# Patient Record
Sex: Female | Born: 1980 | Race: Black or African American | Hispanic: No | Marital: Single | Smoking: Never smoker
Health system: Southern US, Community
[De-identification: ages and names within clinical notes are randomized; demographics above are authoritative.]

## PROBLEM LIST (undated history)

## (undated) ENCOUNTER — Ambulatory Visit: Discharge status: Absent without leave | Payer: Medicaid Other

## (undated) DIAGNOSIS — O169 Unspecified maternal hypertension, unspecified trimester: Secondary | ICD-10-CM

## (undated) HISTORY — PX: OTHER SURGICAL HISTORY: SHX169

## (undated) HISTORY — PX: TUBAL LIGATION: SHX77

---

## 1998-07-08 ENCOUNTER — Emergency Department (HOSPITAL_COMMUNITY): Admission: EM | Admit: 1998-07-08 | Discharge: 1998-07-08 | Payer: Self-pay | Admitting: Emergency Medicine

## 2000-06-21 ENCOUNTER — Emergency Department (HOSPITAL_COMMUNITY): Admission: EM | Admit: 2000-06-21 | Discharge: 2000-06-21 | Payer: Self-pay | Admitting: Emergency Medicine

## 2000-06-21 ENCOUNTER — Encounter: Payer: Self-pay | Admitting: Emergency Medicine

## 2000-06-23 ENCOUNTER — Inpatient Hospital Stay (HOSPITAL_COMMUNITY): Admission: AD | Admit: 2000-06-23 | Discharge: 2000-06-23 | Payer: Self-pay | Admitting: Obstetrics

## 2000-06-25 ENCOUNTER — Inpatient Hospital Stay (HOSPITAL_COMMUNITY): Admission: AD | Admit: 2000-06-25 | Discharge: 2000-06-25 | Payer: Self-pay | Admitting: *Deleted

## 2000-07-23 ENCOUNTER — Inpatient Hospital Stay (HOSPITAL_COMMUNITY): Admission: AD | Admit: 2000-07-23 | Discharge: 2000-07-23 | Payer: Self-pay | Admitting: Obstetrics & Gynecology

## 2000-08-23 ENCOUNTER — Ambulatory Visit (HOSPITAL_COMMUNITY): Admission: RE | Admit: 2000-08-23 | Discharge: 2000-08-23 | Payer: Self-pay | Admitting: *Deleted

## 2000-10-06 ENCOUNTER — Ambulatory Visit (HOSPITAL_COMMUNITY): Admission: RE | Admit: 2000-10-06 | Discharge: 2000-10-06 | Payer: Self-pay | Admitting: *Deleted

## 2000-11-29 ENCOUNTER — Encounter: Payer: Self-pay | Admitting: Obstetrics

## 2000-11-29 ENCOUNTER — Inpatient Hospital Stay (HOSPITAL_COMMUNITY): Admission: AD | Admit: 2000-11-29 | Discharge: 2000-12-05 | Payer: Self-pay | Admitting: Obstetrics

## 2000-12-17 ENCOUNTER — Encounter (HOSPITAL_COMMUNITY): Admission: RE | Admit: 2000-12-17 | Discharge: 2001-01-05 | Payer: Self-pay | Admitting: *Deleted

## 2000-12-23 ENCOUNTER — Encounter: Admission: RE | Admit: 2000-12-23 | Discharge: 2000-12-23 | Payer: Self-pay | Admitting: Obstetrics

## 2000-12-30 ENCOUNTER — Inpatient Hospital Stay (HOSPITAL_COMMUNITY): Admission: AD | Admit: 2000-12-30 | Discharge: 2000-12-30 | Payer: Self-pay | Admitting: Obstetrics & Gynecology

## 2001-01-04 ENCOUNTER — Encounter (INDEPENDENT_AMBULATORY_CARE_PROVIDER_SITE_OTHER): Payer: Self-pay

## 2001-01-04 ENCOUNTER — Inpatient Hospital Stay (HOSPITAL_COMMUNITY): Admission: AD | Admit: 2001-01-04 | Discharge: 2001-01-06 | Payer: Self-pay | Admitting: *Deleted

## 2001-11-27 ENCOUNTER — Emergency Department (HOSPITAL_COMMUNITY): Admission: EM | Admit: 2001-11-27 | Discharge: 2001-11-27 | Payer: Self-pay | Admitting: Emergency Medicine

## 2002-06-14 ENCOUNTER — Emergency Department (HOSPITAL_COMMUNITY): Admission: EM | Admit: 2002-06-14 | Discharge: 2002-06-14 | Payer: Self-pay

## 2002-08-15 ENCOUNTER — Ambulatory Visit (HOSPITAL_COMMUNITY): Admission: RE | Admit: 2002-08-15 | Discharge: 2002-08-15 | Payer: Self-pay | Admitting: *Deleted

## 2002-08-23 ENCOUNTER — Encounter: Admission: RE | Admit: 2002-08-23 | Discharge: 2002-08-23 | Payer: Self-pay | Admitting: *Deleted

## 2002-08-31 ENCOUNTER — Ambulatory Visit (HOSPITAL_COMMUNITY): Admission: RE | Admit: 2002-08-31 | Discharge: 2002-08-31 | Payer: Self-pay | Admitting: *Deleted

## 2002-08-31 ENCOUNTER — Encounter: Admission: RE | Admit: 2002-08-31 | Discharge: 2002-08-31 | Payer: Self-pay | Admitting: *Deleted

## 2002-09-01 ENCOUNTER — Inpatient Hospital Stay (HOSPITAL_COMMUNITY): Admission: AD | Admit: 2002-09-01 | Discharge: 2002-09-04 | Payer: Self-pay | Admitting: *Deleted

## 2002-09-05 ENCOUNTER — Inpatient Hospital Stay (HOSPITAL_COMMUNITY): Admission: AD | Admit: 2002-09-05 | Discharge: 2002-09-05 | Payer: Self-pay | Admitting: *Deleted

## 2002-09-07 ENCOUNTER — Encounter: Admission: RE | Admit: 2002-09-07 | Discharge: 2002-09-07 | Payer: Self-pay | Admitting: *Deleted

## 2002-10-10 ENCOUNTER — Inpatient Hospital Stay (HOSPITAL_COMMUNITY): Admission: AD | Admit: 2002-10-10 | Discharge: 2002-10-10 | Payer: Self-pay | Admitting: *Deleted

## 2002-10-18 ENCOUNTER — Encounter: Admission: RE | Admit: 2002-10-18 | Discharge: 2002-10-18 | Payer: Self-pay | Admitting: *Deleted

## 2002-10-25 ENCOUNTER — Encounter: Admission: RE | Admit: 2002-10-25 | Discharge: 2002-10-25 | Payer: Self-pay | Admitting: *Deleted

## 2002-11-08 ENCOUNTER — Encounter: Admission: RE | Admit: 2002-11-08 | Discharge: 2002-11-08 | Payer: Self-pay | Admitting: *Deleted

## 2002-11-29 ENCOUNTER — Encounter: Admission: RE | Admit: 2002-11-29 | Discharge: 2002-11-29 | Payer: Self-pay | Admitting: *Deleted

## 2002-12-27 ENCOUNTER — Inpatient Hospital Stay: Admission: AD | Admit: 2002-12-27 | Discharge: 2002-12-27 | Payer: Self-pay | Admitting: Obstetrics and Gynecology

## 2002-12-27 ENCOUNTER — Encounter: Admission: RE | Admit: 2002-12-27 | Discharge: 2002-12-27 | Payer: Self-pay | Admitting: *Deleted

## 2002-12-31 ENCOUNTER — Inpatient Hospital Stay (HOSPITAL_COMMUNITY): Admission: AD | Admit: 2002-12-31 | Discharge: 2002-12-31 | Payer: Self-pay | Admitting: *Deleted

## 2003-01-03 ENCOUNTER — Encounter: Admission: RE | Admit: 2003-01-03 | Discharge: 2003-01-03 | Payer: Self-pay | Admitting: *Deleted

## 2003-01-08 ENCOUNTER — Inpatient Hospital Stay (HOSPITAL_COMMUNITY): Admission: AD | Admit: 2003-01-08 | Discharge: 2003-01-08 | Payer: Self-pay | Admitting: Obstetrics & Gynecology

## 2003-01-10 ENCOUNTER — Encounter: Admission: RE | Admit: 2003-01-10 | Discharge: 2003-01-10 | Payer: Self-pay | Admitting: *Deleted

## 2003-01-14 ENCOUNTER — Observation Stay (HOSPITAL_COMMUNITY): Admission: AD | Admit: 2003-01-14 | Discharge: 2003-01-14 | Payer: Self-pay | Admitting: *Deleted

## 2003-01-17 ENCOUNTER — Encounter: Admission: RE | Admit: 2003-01-17 | Discharge: 2003-01-17 | Payer: Self-pay | Admitting: *Deleted

## 2003-01-24 ENCOUNTER — Encounter: Admission: RE | Admit: 2003-01-24 | Discharge: 2003-01-24 | Payer: Self-pay | Admitting: Family Medicine

## 2003-01-26 ENCOUNTER — Inpatient Hospital Stay (HOSPITAL_COMMUNITY): Admission: AD | Admit: 2003-01-26 | Discharge: 2003-01-26 | Payer: Self-pay | Admitting: Obstetrics & Gynecology

## 2003-01-31 ENCOUNTER — Encounter: Admission: RE | Admit: 2003-01-31 | Discharge: 2003-01-31 | Payer: Self-pay | Admitting: *Deleted

## 2003-02-01 ENCOUNTER — Inpatient Hospital Stay (HOSPITAL_COMMUNITY): Admission: AD | Admit: 2003-02-01 | Discharge: 2003-02-04 | Payer: Self-pay | Admitting: Obstetrics & Gynecology

## 2003-02-02 ENCOUNTER — Encounter (INDEPENDENT_AMBULATORY_CARE_PROVIDER_SITE_OTHER): Payer: Self-pay | Admitting: *Deleted

## 2004-11-27 ENCOUNTER — Emergency Department (HOSPITAL_COMMUNITY): Admission: EM | Admit: 2004-11-27 | Discharge: 2004-11-27 | Payer: Self-pay | Admitting: Emergency Medicine

## 2007-02-19 ENCOUNTER — Emergency Department (HOSPITAL_COMMUNITY): Admission: EM | Admit: 2007-02-19 | Discharge: 2007-02-19 | Payer: Self-pay | Admitting: Emergency Medicine

## 2007-08-01 ENCOUNTER — Emergency Department (HOSPITAL_COMMUNITY): Admission: EM | Admit: 2007-08-01 | Discharge: 2007-08-01 | Payer: Self-pay | Admitting: Emergency Medicine

## 2007-08-08 ENCOUNTER — Emergency Department (HOSPITAL_COMMUNITY): Admission: EM | Admit: 2007-08-08 | Discharge: 2007-08-08 | Payer: Self-pay | Admitting: Emergency Medicine

## 2009-03-21 ENCOUNTER — Emergency Department (HOSPITAL_COMMUNITY): Admission: EM | Admit: 2009-03-21 | Discharge: 2009-03-22 | Payer: Self-pay | Admitting: Emergency Medicine

## 2009-03-23 ENCOUNTER — Emergency Department (HOSPITAL_COMMUNITY): Admission: EM | Admit: 2009-03-23 | Discharge: 2009-03-23 | Payer: Self-pay | Admitting: Emergency Medicine

## 2010-09-09 ENCOUNTER — Emergency Department (HOSPITAL_COMMUNITY)
Admission: EM | Admit: 2010-09-09 | Discharge: 2010-09-09 | Disposition: A | Discharge status: Home / Self Care | Payer: Self-pay | Attending: Emergency Medicine | Admitting: Emergency Medicine

## 2010-09-09 DIAGNOSIS — K5289 Other specified noninfective gastroenteritis and colitis: Secondary | ICD-10-CM | POA: Insufficient documentation

## 2010-09-09 DIAGNOSIS — R112 Nausea with vomiting, unspecified: Secondary | ICD-10-CM | POA: Insufficient documentation

## 2010-09-09 DIAGNOSIS — R197 Diarrhea, unspecified: Secondary | ICD-10-CM | POA: Insufficient documentation

## 2010-09-09 DIAGNOSIS — R5381 Other malaise: Secondary | ICD-10-CM | POA: Insufficient documentation

## 2010-09-09 DIAGNOSIS — R109 Unspecified abdominal pain: Secondary | ICD-10-CM | POA: Insufficient documentation

## 2010-09-09 LAB — URINE MICROSCOPIC-ADD ON

## 2010-09-09 LAB — POCT I-STAT, CHEM 8
BUN: 8 mg/dL (ref 6–23)
Calcium, Ion: 1.14 mmol/L (ref 1.12–1.32)
Hemoglobin: 14.3 g/dL (ref 12.0–15.0)
TCO2: 23 mmol/L (ref 0–100)

## 2010-09-09 LAB — POCT PREGNANCY, URINE

## 2010-09-09 LAB — URINALYSIS, ROUTINE W REFLEX MICROSCOPIC
Urine Glucose, Fasting: NEGATIVE mg/dL
Urobilinogen, UA: 1 mg/dL (ref 0.0–1.0)

## 2010-09-10 LAB — URINE CULTURE: Culture: NO GROWTH

## 2010-11-28 NOTE — Op Note (Signed)
   Destiny Wallace, Destiny Wallace                  ACCOUNT NO.:  [id number]   MEDICAL RECORD NO.:  [id number]                   PATIENT TYPE:  INP   LOCATION:  9159                                 FACILITY:  [facility]   PHYSICIAN:  Conni Elliot, M.D.             DATE OF BIRTH:  23-Sep-1980   DATE OF PROCEDURE:  09/02/2002  DATE OF DISCHARGE:  09/04/2002                                 OPERATIVE REPORT   PREOPERATIVE DIAGNOSES:  Weak cervix.   POSTOPERATIVE DIAGNOSES:  Weak cervix.   OPERATION:  Modified McDonald cerclage.   OPERATOR:  Conni Elliot, M.D.   ANESTHESIA:  Spinal.                                                 Conni Elliot, M.D.    ASG/MEDQ  D:  10/03/2002  T:  10/03/2002  Job:  [id number]

## 2010-11-28 NOTE — Discharge Summary (Signed)
   Destiny Wallace, Destiny Wallace                  ACCOUNT NO.:  [id number]   MEDICAL RECORD NO.:  [id number]                   PATIENT TYPE:  INP   LOCATION:  9159                                 FACILITY:  [facility]   PHYSICIAN:  Maurice March, M.D.                  DATE OF BIRTH:  08-08-1980   DATE OF ADMISSION:  09/01/2002  DATE OF DISCHARGE:  09/04/2002                                 DISCHARGE SUMMARY   ADMISSION DIAGNOSES:  32. A 30 year old G2, P1-0-0-1, at 18-6/7 weeks.  2. Shortened cervical length, digital examination, and funneling of cervix     on ultrasound.   DISCHARGE DIAGNOSES:  59. A 30 year old, G2, P1-0-0-1, at 19-3/7 weeks.  2. Postoperative day #2, status post Cerclage placement.  3. Pre-term uterine contractions, resolved with tocolysis.   DISCHARGE MEDICATIONS:  1. Procardia 10 mg p.o. q.6h.  2. Prenatal vitamins one p.o. daily.   HISTORY OF PRESENT ILLNESS:  The patient is a 30 year old, G2, P1-0-0-1, who  presented at 18-6/7 weeks.  [facility] she was noted to have a  shortened cervical length on examination.  Antiphospholipid panel was drawn.  An ultrasound showed a dynamic cervix with funneling.  She is brought into  the hospital for Cerclage.   HOSPITAL COURSE:  The Cerclage was placed on hospital day #1.  The patient  tolerated the procedure well.  However, following this she began having  cramping which felt like menstrual cramps.  She did have some contractions  noted on tocometer.  She was given subcutaneous terbutaline x2, and was  placed on Procardia.  Following the tocolysis, the patient had no further  contractions.  On the day of discharge, the patient had one contraction  noted on the monitor within 12 hours.   CONDITION ON DISCHARGE:  The patient was discharged home in stable  condition.   DISCHARGE INSTRUCTIONS:  The patient was told of her medical regimen as well  as to follow up in [facility] on 09/07/02 in the morning.  She  was told  to have no intercourse and modified bed rest until her appointment.  She is  given a note for her parole officer to excuse her for the last three days  since she was hospitalized.                                               Maurice March, M.D.    LC/MEDQ  D:  09/04/2002  T:  09/04/2002  Job:  [id number]

## 2010-11-28 NOTE — Discharge Summary (Signed)
[facility]  Patient:    Destiny Wallace, Destiny Wallace                      MRN: [id number] Adm. Date:  [id number] Disc. Date: 12/05/00 Attending:  Tammi Sou Dictator:   Marlinda Mike, C.N.M.                           Discharge Summary  ADMITTING DIAGNOSES:          Intrauterine pregnancy at 18 and 6, preterm labor.  DISCHARGE DIAGNOSES:          A 32 5/7 week, preterm labor stable on Procardia tocolytic.  HOSPITAL COURSE:              Patient received complete OB ultrasound on May 20.  Ultrasound revealed single fetus, cephalic position, placenta anterior grade 2, questionable circumvallate placenta, amniotic fluid within normal limits with a total volume of 17.2.  Anatomy had been demonstrated at previous ultrasounds.  Biophysical description would equal 6/8.  Absent breathing movements on examination.  Appropriate fetal growth with growth in the 35th percentile.  Cervical length:  No measurable cervix visible on translabial ultrasound.  Transvaginal ultrasound not done.  The patient was admitted to labor and delivery for magnesium sulfate tocolytics.  Patient received magnesium sulfate from Nov 29, 2000 to Dec 03, 2000.  Patient received Unasyn 3 g IV q.6h. from May 20 until day of discharge.  Course of steroids completed during hospitalization with betamethasone 12.5 mg IM.  Same dose repeated at 24 hours for a total of two doses.  Complete bed rest.  On examination cervix was 2-3 cm with a bulging bag of water.  Heart rate tracing was reactive with contractions every two minutes.  On day of discharge cervix was 1-2 cm, posterior, vertex ballottable at a -2 station, no bag of waters bulging. Cervix stable at discharge.  Patient started on Procardia XL 60 mg p.o. daily dose with no increase in contraction activity.  Patient discharged home on same medication.  At day of discharge contractions only occasional with no uterine irritability.  Other  hospital care includes urine drug screen with negative results.  CBC:  White blood cell count 9.4, hemoglobin 11.6, hematocrit 34.6, platelet count 253,000.  Group beta strep vaginal culture done with a negative result.  Wet prep done.  Many white blood cells.  No trich.  No yeast.  Few clue cells.  Too numerous to count bacteria. Discharged Dec 05, 2000 in stable condition, stable on Procardia XL 60 mg q.d. Discharged home on complete bed rest and pelvic rest, on preterm labor precautions.  To follow-up at [facility] this week.  Patient to call Tuesday morning for appointment time. DD:  12/05/00 TD:  12/05/00 Job: [id number]

## 2010-11-28 NOTE — Op Note (Signed)
   NAME:  Destiny Wallace, Destiny Wallace                         ACCOUNT NO.:  [id number]   MEDICAL RECORD NO.:  [id number]                   PATIENT TYPE:  INP   LOCATION:  9114                                 FACILITY:  [facility]   PHYSICIAN:  Clement Husbands, M.D.         DATE OF BIRTH:  [birth year]   DATE OF PROCEDURE:  02/02/2003  DATE OF DISCHARGE:                                 OPERATIVE REPORT   PREOPERATIVE DIAGNOSIS:  Requested sterilization.   POSTOPERATIVE DIAGNOSIS:  Requested sterilization.   OPERATION:  Postpartum bilateral tubal sterilization with partial  salpingectomy.   SURGEON:  Burnadette Peter, M.D.   ANESTHESIA:  Epidural.   PROCEDURE:  With the patient under satisfactory epidural in a supine  position, the abdomen is prepped and draped.  A small transverse  infraumbilical skin incision was made and carried down sharply through the  rectus fascia into the peritoneal cavity.  The fundal portion of the uterus  was seen.  The right fallopian tube was grasped with Babcock clamp and  followed down to the fimbriae, which was visualized.  The ovary was not  identified.  The midportion of the right fallopian tube was then doubly  ligated with 0 plain catgut with the ligated segment excised with non-  apposing edges.  The left side was then mobilized.  What was thought to be  the left fallopian tube was identified and was followed down posteriorly.  The surface of the ovary could be seen.  The fimbriae of the left fallopian  tube, however, were bound down posteriorly but were not specifically  visualized.  The length of what appeared to be fallopian tube that was  visualized from the cornu area and then as far distally was at least 4-1/2  inches.  The midportion of what appeared to be fallopian tube was then  doubly ligated with 0 plain catgut and the segment of fallopian tube excised  with non-apposing edges.  This segment was inspected, and it appeared to be  fallopian tube.   Both segments were sent separately to pathology with a  specific note to identify the left tubal segment.   The fascia and peritoneum were closed simultaneously with a running 0 Vicryl  suture.  Skin edges were approximated with a 3-0 Vicryl subcuticular stitch.  A pressure dressing was applied.  Estimated blood loss just a few  milliliters.  Sponge and needle count was correct.  She tolerated the  procedure well, was returned to the recovery room in satisfactory condition.                                               Clement Husbands, M.D.    EFR/MEDQ  D:  02/02/2003  T:  02/03/2003  Job:  [id number]

## 2010-11-28 NOTE — Op Note (Signed)
   NAMEFINLEIGH, CHEONG                  ACCOUNT NO.:  [id number]   MEDICAL RECORD NO.:  [id number]                   PATIENT TYPE:  INP   LOCATION:  9311                                 FACILITY:  [facility]   PHYSICIAN:  Conni Elliot, M.D.             DATE OF BIRTH:  18-Dec-1980   DATE OF PROCEDURE:  09/02/2002  DATE OF DISCHARGE:                                 OPERATIVE REPORT   PREOPERATIVE DIAGNOSIS:  Weak cervix.   POSTOPERATIVE DIAGNOSIS:  Weak cervix.   PROCEDURE:  Cervical cerclage.   SURGEON:  Conni Elliot, M.D.   ANESTHESIA:  Spinal.   DESCRIPTION OF PROCEDURE:  After placement of a spinal anesthetic, the  patient supine in the dorsal lithotomy position.  The perineum and vagina  were prepped with Betadine solution.  A Foley catheter was placed for  constant drainage.  A weighted speculum was placed in the posterior vagina.  A Cleocin douche was placed.  This was then removed.  Anterior cervix was  grasped with a sponge stick and a cervical cerclage was placed using #4 silk  double-stranded, starting at 12 o'clock and going counterclockwise.  This  was tied again at 12 o'clock.  Estimated blood loss was less than 10 mL.  Speculum removed.                                               Conni Elliot, M.D.    ASG/MEDQ  D:  09/02/2002  T:  09/02/2002  Job:  [id number]

## 2011-03-08 ENCOUNTER — Emergency Department (HOSPITAL_COMMUNITY)
Admission: EM | Admit: 2011-03-08 | Discharge: 2011-03-08 | Disposition: A | Discharge status: Home / Self Care | Payer: Self-pay | Attending: Emergency Medicine | Admitting: Emergency Medicine

## 2011-03-08 DIAGNOSIS — B850 Pediculosis due to Pediculus humanus capitis: Secondary | ICD-10-CM | POA: Insufficient documentation

## 2011-04-02 ENCOUNTER — Emergency Department (HOSPITAL_COMMUNITY)
Admission: EM | Admit: 2011-04-02 | Discharge: 2011-04-02 | Disposition: A | Discharge status: Home / Self Care | Payer: Self-pay | Attending: Emergency Medicine | Admitting: Emergency Medicine

## 2011-04-02 DIAGNOSIS — R220 Localized swelling, mass and lump, head: Secondary | ICD-10-CM | POA: Insufficient documentation

## 2011-04-02 DIAGNOSIS — K029 Dental caries, unspecified: Secondary | ICD-10-CM | POA: Insufficient documentation

## 2011-04-02 DIAGNOSIS — K006 Disturbances in tooth eruption: Secondary | ICD-10-CM | POA: Insufficient documentation

## 2011-04-02 DIAGNOSIS — K089 Disorder of teeth and supporting structures, unspecified: Secondary | ICD-10-CM | POA: Insufficient documentation

## 2011-04-02 LAB — CBC
Hemoglobin: 11.6 — ABNORMAL LOW
Hemoglobin: 12.6
MCHC: 33.1
RBC: 4.09
RBC: 4.48
RDW: 15.7 — ABNORMAL HIGH

## 2011-04-02 LAB — DIFFERENTIAL
Basophils Absolute: 0
Lymphocytes Relative: 33
Lymphocytes Relative: 5 — ABNORMAL LOW
Monocytes Absolute: 0.7
Monocytes Absolute: 0.7
Monocytes Relative: 11
Monocytes Relative: 18 — ABNORMAL HIGH
Neutro Abs: 1.6 — ABNORMAL LOW
Neutro Abs: 4.9

## 2011-04-02 LAB — I-STAT 8, (EC8 V) (CONVERTED LAB)
Chloride: 106
Glucose, Bld: 97
Potassium: 3.6
pH, Ven: 7.378 — ABNORMAL HIGH

## 2011-04-02 LAB — POCT I-STAT CREATININE
Creatinine, Ser: 1
Operator id: 198171

## 2011-04-02 LAB — RPR: RPR Ser Ql: NONREACTIVE

## 2011-04-02 LAB — WET PREP, GENITAL: Yeast Wet Prep HPF POC: NONE SEEN

## 2011-11-16 ENCOUNTER — Encounter (HOSPITAL_COMMUNITY): Payer: Self-pay | Admitting: Nurse Practitioner

## 2011-11-16 ENCOUNTER — Emergency Department (HOSPITAL_COMMUNITY)
Admission: EM | Admit: 2011-11-16 | Discharge: 2011-11-16 | Disposition: A | Discharge status: Home / Self Care | Payer: Self-pay | Attending: Emergency Medicine | Admitting: Emergency Medicine

## 2011-11-16 DIAGNOSIS — L0201 Cutaneous abscess of face: Secondary | ICD-10-CM | POA: Insufficient documentation

## 2011-11-16 DIAGNOSIS — L03211 Cellulitis of face: Secondary | ICD-10-CM | POA: Insufficient documentation

## 2011-11-16 DIAGNOSIS — R229 Localized swelling, mass and lump, unspecified: Secondary | ICD-10-CM | POA: Insufficient documentation

## 2011-11-16 DIAGNOSIS — L089 Local infection of the skin and subcutaneous tissue, unspecified: Secondary | ICD-10-CM

## 2011-11-16 NOTE — ED Notes (Signed)
Reports a "bump" to R cheek just below R eye onset a year ago but over past few days increased swelling and itching. No drainage.

## 2011-11-16 NOTE — Discharge Instructions (Signed)

## 2011-11-16 NOTE — ED Provider Notes (Addendum)
This chart was scribed for Gwyneth Sprout, MD by Williemae Natter. The patient was seen in room STRE5/STRE5 at 3:03 PM.  History     CSN: [id number]  Arrival date & time 11/16/11  1349   First MD Initiated Contact with Patient 11/16/11 1459      Chief Complaint  Patient presents with  . Skin Problem    (Consider location/radiation/quality/duration/timing/severity/associated sxs/prior treatment) HPI Destiny Wallace is a 31 y.o. female who presents to the Emergency Department complaining of a skin problem. Pt has an abscess on cheek under right eye for one year. Pt noted increase in size and swelling in the past week.  History reviewed. No pertinent past medical history.  History reviewed. No pertinent past surgical history.  History reviewed. No pertinent family history.  History  Substance Use Topics  . Smoking status: Former Games developer  . Smokeless tobacco: Former Neurosurgeon    Quit date: 07/19/2011  . Alcohol Use: Yes     10    OB History    Grav Para Term Preterm Abortions TAB SAB Ect Mult Living                  Review of Systems  Constitutional: Negative for fever and chills.  Respiratory: Negative for shortness of breath.   Gastrointestinal: Negative for nausea and vomiting.  Neurological: Negative for weakness.    Allergies  Review of patient's allergies indicates no known allergies.  Home Medications   Current Outpatient Rx  Name Route Sig Dispense Refill  . ACETAMINOPHEN 325 MG PO TABS Oral Take 650 mg by mouth every 6 (six) hours as needed. For pain      BP 103/66  Pulse 66  Temp(Src) 98.4 F (36.9 C) (Oral)  Resp 18  Ht 5\' 7"  (1.702 m)  Wt 160 lb (72.576 kg)  BMI 25.06 kg/m2  SpO2 99%  Physical Exam  Nursing note and vitals reviewed. Constitutional: She is oriented to person, place, and time. She appears well-developed and well-nourished. No distress.  HENT:  Head: Normocephalic and atraumatic.  Eyes: EOM are normal.  Musculoskeletal: Normal  range of motion.  Neurological: She is alert and oriented to person, place, and time.  Skin: Skin is warm and dry.       Pea sized abscess distal to the right eye  Psychiatric: She has a normal mood and affect. Her behavior is normal.    ED Course  Procedures (including critical care time)  Labs Reviewed - No data to display No results found. INCISION AND DRAINAGE Performed by: Gwyneth Sprout Consent: Verbal consent obtained. Risks and benefits: risks, benefits and alternatives were discussed Type: abscess  Body area: right cheek  Anesthesia: local infiltration  Local anesthetic: lidocaine 2% with epinephrine  Anesthetic total: 2 ml  Complexity: simple needle drainage Drainage: purulent  Drainage amount: 3mL  Packing material: none  Patient tolerance: Patient tolerated the procedure well with no immediate complications.     1. Infected sebaceous cyst       MDM   Patient with an infected sebaceous cyst under the right eye today. Area anesthetized and drained with large amount of sebaceous material removed and pus. No need for antibiotics today and patient will do warm soaks to  I personally performed the services described in this documentation, which was scribed in my presence.  The recorded information has been reviewed and considered.        Gwyneth Sprout, MD 11/16/11 1515  Gwyneth Sprout, MD 11/16/11 1517

## 2012-05-31 ENCOUNTER — Encounter (HOSPITAL_COMMUNITY): Payer: Self-pay | Admitting: Emergency Medicine

## 2012-05-31 ENCOUNTER — Emergency Department (HOSPITAL_COMMUNITY)
Admission: EM | Admit: 2012-05-31 | Discharge: 2012-05-31 | Disposition: A | Discharge status: Home / Self Care | Payer: Medicaid Other | Attending: Emergency Medicine | Admitting: Emergency Medicine

## 2012-05-31 DIAGNOSIS — T148XXA Other injury of unspecified body region, initial encounter: Secondary | ICD-10-CM

## 2012-05-31 DIAGNOSIS — Z87891 Personal history of nicotine dependence: Secondary | ICD-10-CM | POA: Insufficient documentation

## 2012-05-31 DIAGNOSIS — S139XXA Sprain of joints and ligaments of unspecified parts of neck, initial encounter: Secondary | ICD-10-CM | POA: Insufficient documentation

## 2012-05-31 DIAGNOSIS — Y929 Unspecified place or not applicable: Secondary | ICD-10-CM | POA: Insufficient documentation

## 2012-05-31 DIAGNOSIS — Y939 Activity, unspecified: Secondary | ICD-10-CM | POA: Insufficient documentation

## 2012-05-31 DIAGNOSIS — X58XXXA Exposure to other specified factors, initial encounter: Secondary | ICD-10-CM | POA: Insufficient documentation

## 2012-05-31 HISTORY — DX: Unspecified maternal hypertension, unspecified trimester: O16.9

## 2012-05-31 MED ORDER — METHOCARBAMOL 500 MG PO TABS: 500.0000 mg | ORAL_TABLET | Freq: Two times a day (BID) | ORAL | Status: DC

## 2012-05-31 MED ORDER — HYDROCODONE-ACETAMINOPHEN 5-325 MG PO TABS: 1.0000 | ORAL_TABLET | ORAL | Status: DC | PRN

## 2012-05-31 NOTE — ED Provider Notes (Signed)
History     CSN: [id number]  Arrival date & time 05/31/12  2011   First MD Initiated Contact with Patient 05/31/12 2107      Chief Complaint  Patient presents with  . Back Pain   HPI  History provided by the patient. Patient is a 31 year old female who presents with complaints of right upper back pain and discomfort. Patient reports that symptoms began earlier today have gradually worsened. Symptoms seem worse with certain movements and positions. Patient states symptoms began shortly after finishing her day of school. 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 She does not recall any specific strenuous activity or any heavy lifting today out of the ordinary. Pain is located in the right mid upper back near the scapula. Patient denies any abdominal pain. She denies any fever, chills or sweats. Denies any nausea vomiting. Denies any urinary or fecal incontinence, urinary retention or perineal numbness. Pain does not radiate. Patient has not had any long travel. Does not used estrogen. Is not have history of PE or DVT. She has no recent surgery. No history of cancer. No hemoptysis.    Past Medical History  Diagnosis Date  . Hypertension during pregnancy     Past Surgical History  Procedure Date  . Circlage     No family history on file.  History  Substance Use Topics  . Smoking status: Former Games developer  . Smokeless tobacco: Former Neurosurgeon    Quit date: 07/19/2011  . Alcohol Use: Yes     Comment: 10    OB History    Grav Para Term Preterm Abortions TAB SAB Ect Mult Living                  Review of Systems  Constitutional: Negative for fever, chills and diaphoresis.  Respiratory: Negative for cough and shortness of breath.   Cardiovascular: Negative for chest pain.  Gastrointestinal: Negative for nausea, vomiting, diarrhea and constipation.  Genitourinary: Negative for dysuria, frequency, hematuria and flank pain.  Musculoskeletal: Positive for back  pain.  Skin: Negative for rash.  Neurological: Negative for weakness and numbness.  All other systems reviewed and are negative.    Allergies  Review of patient's allergies indicates no known allergies.  Home Medications   Current Outpatient Rx  Name  Route  Sig  Dispense  Refill  . DOXYCYCLINE HYCLATE 100 MG PO CAPS   Oral   Take 100 mg by mouth 2 (two) times daily. Take for 10 days         . METRONIDAZOLE 500 MG PO TABS   Oral   Take 500 mg by mouth 2 (two) times daily. Take for 10 days         . TRAMADOL HCL 50 MG PO TABS   Oral   Take 50 mg by mouth every 6 (six) hours as needed. For pain           BP 121/84  Pulse 62  Temp 98.3 F (36.8 C) (Oral)  Resp 14  SpO2 100%  LMP 05/16/2012  Physical Exam  Nursing note and vitals reviewed. Constitutional: She is oriented to person, place, and time. She appears well-developed and well-nourished. No distress.  HENT:  Head: Normocephalic.  Cardiovascular: Normal rate and regular rhythm.   No murmur heard. Pulmonary/Chest: Effort normal and breath sounds normal. No respiratory distress. She has no wheezes. She has no rales.  Abdominal: Soft. There is no tenderness. There is no rebound and  no guarding.  Musculoskeletal:       Thoracic back: She exhibits tenderness. She exhibits no bony tenderness, no edema and no deformity.       Back:  Neurological: She is alert and oriented to person, place, and time.  Skin: Skin is warm and dry.  Psychiatric: She has a normal mood and affect. Her behavior is normal.    ED Course  Procedures     1. Muscle strain       MDM  9:20PM patient seen and evaluated. Patient appears mild discomfort but no acute distress.  Patient reports taking medicines she was given by her uncle. Pills are small yellow in color with markings 283 on one side and IG on the back side. This corresponds to cyclobenzaprine or Flexeril.  Patient has no red flag symptoms for back pain. Patient is  PERC negative. No indications for imaging at this time.     Angus Seller, PA 05/31/12 2241

## 2012-05-31 NOTE — ED Notes (Signed)
PT. REPORTS RIGHT BACK PAIN ONSET TODAY DENIES INJURY OR FALL , AMBULATORY , RESPIRATIONS UNLABORED.

## 2012-05-31 NOTE — ED Notes (Signed)
Patient said she came home from class and started cooking for her kids and felt a pain in her neck that radiated down her side.  She denies heavy lifting, or turning or any injury.

## 2012-06-01 NOTE — ED Provider Notes (Signed)
Medical screening examination/treatment/procedure(s) were performed by non-physician practitioner and as supervising physician I was immediately available for consultation/collaboration.  Kayden Amend T Travin Marik, MD 06/01/12 2241 

## 2013-02-21 ENCOUNTER — Encounter (HOSPITAL_COMMUNITY): Payer: Self-pay | Admitting: *Deleted

## 2013-02-21 DIAGNOSIS — N390 Urinary tract infection, site not specified: Secondary | ICD-10-CM | POA: Insufficient documentation

## 2013-02-21 DIAGNOSIS — R319 Hematuria, unspecified: Secondary | ICD-10-CM | POA: Insufficient documentation

## 2013-02-21 DIAGNOSIS — N760 Acute vaginitis: Secondary | ICD-10-CM | POA: Insufficient documentation

## 2013-02-21 DIAGNOSIS — Z3202 Encounter for pregnancy test, result negative: Secondary | ICD-10-CM | POA: Insufficient documentation

## 2013-02-21 DIAGNOSIS — Z87891 Personal history of nicotine dependence: Secondary | ICD-10-CM | POA: Insufficient documentation

## 2013-02-21 DIAGNOSIS — R300 Dysuria: Secondary | ICD-10-CM | POA: Insufficient documentation

## 2013-02-21 DIAGNOSIS — N898 Other specified noninflammatory disorders of vagina: Secondary | ICD-10-CM | POA: Insufficient documentation

## 2013-02-21 LAB — POCT PREGNANCY, URINE: Preg Test, Ur: NEGATIVE

## 2013-02-21 NOTE — ED Notes (Signed)
The pt reports the she has been voiding in small amounts and she does not feel like she can empty her bladder and a vaginal discharge for 4 days.  lmp last month

## 2013-02-22 ENCOUNTER — Emergency Department (HOSPITAL_COMMUNITY)
Admission: EM | Admit: 2013-02-22 | Discharge: 2013-02-22 | Disposition: A | Discharge status: Home / Self Care | Payer: Medicaid Other | Attending: Emergency Medicine | Admitting: Emergency Medicine

## 2013-02-22 DIAGNOSIS — N390 Urinary tract infection, site not specified: Secondary | ICD-10-CM

## 2013-02-22 DIAGNOSIS — B9689 Other specified bacterial agents as the cause of diseases classified elsewhere: Secondary | ICD-10-CM

## 2013-02-22 DIAGNOSIS — N760 Acute vaginitis: Secondary | ICD-10-CM

## 2013-02-22 LAB — COMPREHENSIVE METABOLIC PANEL
AST: 16 U/L (ref 0–37)
Albumin: 3.7 g/dL (ref 3.5–5.2)
Calcium: 9.3 mg/dL (ref 8.4–10.5)
Chloride: 103 mEq/L (ref 96–112)
Creatinine, Ser: 0.88 mg/dL (ref 0.50–1.10)
Total Protein: 7.1 g/dL (ref 6.0–8.3)

## 2013-02-22 LAB — CBC WITH DIFFERENTIAL/PLATELET
Basophils Absolute: 0 10*3/uL (ref 0.0–0.1)
Eosinophils Absolute: 0.1 10*3/uL (ref 0.0–0.7)
HCT: 32.6 % — ABNORMAL LOW (ref 36.0–46.0)
Lymphocytes Relative: 32 % (ref 12–46)
Lymphs Abs: 1.8 10*3/uL (ref 0.7–4.0)
MCH: 22 pg — ABNORMAL LOW (ref 26.0–34.0)
MCHC: 30.4 g/dL (ref 30.0–36.0)
Monocytes Absolute: 0.4 10*3/uL (ref 0.1–1.0)
Neutro Abs: 3.4 10*3/uL (ref 1.7–7.7)
RDW: 18.2 % — ABNORMAL HIGH (ref 11.5–15.5)

## 2013-02-22 LAB — URINALYSIS, ROUTINE W REFLEX MICROSCOPIC
Glucose, UA: NEGATIVE mg/dL
Nitrite: NEGATIVE
Specific Gravity, Urine: 1.03 (ref 1.005–1.030)
pH: 6 (ref 5.0–8.0)

## 2013-02-22 LAB — WET PREP, GENITAL: WBC, Wet Prep HPF POC: NONE SEEN

## 2013-02-22 MED ORDER — METRONIDAZOLE 500 MG PO TABS: 500.0000 mg | ORAL_TABLET | Freq: Two times a day (BID) | ORAL | Status: DC

## 2013-02-22 MED ORDER — NITROFURANTOIN MONOHYD MACRO 100 MG PO CAPS
100.0000 mg | ORAL_CAPSULE | Freq: Once | ORAL | Status: AC
  Administered 2013-02-22: 100 mg via ORAL
  Filled 2013-02-22: qty 1

## 2013-02-22 MED ORDER — TRAMADOL HCL 50 MG PO TABS
50.0000 mg | ORAL_TABLET | Freq: Once | ORAL | Status: AC
  Administered 2013-02-22: 50 mg via ORAL
  Filled 2013-02-22: qty 1

## 2013-02-22 MED ORDER — NITROFURANTOIN MONOHYD MACRO 100 MG PO CAPS: 100.0000 mg | ORAL_CAPSULE | Freq: Two times a day (BID) | ORAL | Status: DC

## 2013-02-22 MED ORDER — PHENAZOPYRIDINE HCL 200 MG PO TABS: 200.0000 mg | ORAL_TABLET | Freq: Three times a day (TID) | ORAL | Status: DC | PRN

## 2013-02-22 NOTE — ED Provider Notes (Signed)
CSN: [id number]     Arrival date & time 02/21/13  2230 History     First MD Initiated Contact with Patient 02/22/13 0234     Chief Complaint  Patient presents with  . Abdominal Pain   (Consider location/radiation/quality/duration/timing/severity/associated sxs/prior Treatment) Patient is a 32 y.o. female presenting with dysuria. The history is provided by the patient.  Dysuria Pain quality:  Aching Pain severity:  Moderate Onset quality:  Gradual Timing:  Constant Progression:  Unchanged Chronicity:  New Relieved by:  Nothing Worsened by:  Nothing tried Ineffective treatments:  None tried Urinary symptoms: hematuria   Associated symptoms: vaginal discharge   Risk factors: no kidney transplant     Past Medical History  Diagnosis Date  . Hypertension during pregnancy    Past Surgical History  Procedure Laterality Date  . Circlage     No family history on file. History  Substance Use Topics  . Smoking status: Former Games developer  . Smokeless tobacco: Former Neurosurgeon    Quit date: 07/19/2011  . Alcohol Use: Yes     Comment: 10   OB History   Grav Para Term Preterm Abortions TAB SAB Ect Mult Living                 Review of Systems  Genitourinary: Positive for dysuria and vaginal discharge.  All other systems reviewed and are negative.    Allergies  Review of patient's allergies indicates no known allergies.  Home Medications  No current outpatient prescriptions on file. BP 116/71  Pulse 72  Temp(Src) 98.2 F (36.8 C) (Oral)  Resp 16  SpO2 99%  LMP 01/21/2013 Physical Exam  Constitutional: She is oriented to person, place, and time. She appears well-developed and well-nourished. No distress.  HENT:  Head: Normocephalic and atraumatic.  Mouth/Throat: Oropharynx is clear and moist.  Eyes: Conjunctivae are normal. Pupils are equal, round, and reactive to light.  Neck: Normal range of motion. Neck supple.  Cardiovascular: Normal rate, regular rhythm and intact  distal pulses.   Pulmonary/Chest: Effort normal and breath sounds normal. She has no wheezes. She has no rales.  Abdominal: Soft. Bowel sounds are normal. There is no tenderness. There is no rebound and no guarding.  Musculoskeletal: Normal range of motion.  Neurological: She is alert and oriented to person, place, and time.  Skin: Skin is warm and dry.  Psychiatric: She has a normal mood and affect.    ED Course   Procedures (including critical care time)  Labs Reviewed  CBC WITH DIFFERENTIAL - Abnormal; Notable for the following:    Hemoglobin 9.9 (*)    HCT 32.6 (*)    MCV 72.6 (*)    MCH 22.0 (*)    RDW 18.2 (*)    All other components within normal limits  COMPREHENSIVE METABOLIC PANEL - Abnormal; Notable for the following:    Potassium 3.0 (*)    Glucose, Bld 104 (*)    Total Bilirubin 0.1 (*)    GFR calc non Af Amer 85 (*)    All other components within normal limits  URINALYSIS, ROUTINE W REFLEX MICROSCOPIC - Abnormal; Notable for the following:    APPearance CLOUDY (*)    Hgb urine dipstick LARGE (*)    Protein, ur 100 (*)    Leukocytes, UA SMALL (*)    All other components within normal limits  URINE MICROSCOPIC-ADD ON - Abnormal; Notable for the following:    Squamous Epithelial / LPF MANY (*)    Bacteria,  UA MANY (*)    All other components within normal limits  URINE CULTURE  LIPASE, BLOOD  POCT PREGNANCY, URINE   No results found. No diagnosis found.  MDM  Will treat for UTI and bacterial vaginosis  Purcell Jungbluth K Tiahna Cure-Rasch, MD 02/22/13 [phone]

## 2013-02-22 NOTE — ED Notes (Signed)
MD and tech at bedside for pt exam, will continue to monitor pt.

## 2013-02-22 NOTE — ED Notes (Signed)
Please see down time forums for pt documentation from 00;25-01:50 

## 2013-02-23 LAB — URINE CULTURE: Colony Count: >=100000

## 2013-02-25 ENCOUNTER — Telehealth (HOSPITAL_COMMUNITY): Payer: Self-pay | Admitting: Emergency Medicine

## 2013-02-25 NOTE — ED Notes (Signed)
Post ED Visit - Positive Culture Follow-up  Culture report reviewed by antimicrobial stewardship pharmacist: []  Wes Dulaney, Pharm.D., BCPS [x]  Celedonio Miyamoto, Pharm.D., BCPS []  Georgina Pillion, [address].D., BCPS []  [place], [address].D., BCPS, AAHIVP []  Estella Husk, Pharm.D., BCPS, AAHIVP  Positive urine culture Treated with Macrobid, organism sensitive to the same and no further patient follow-up is required at this time.  Kylie A Holland 02/25/2013, 11:08 AM

## 2013-03-30 ENCOUNTER — Encounter (HOSPITAL_COMMUNITY): Payer: Self-pay | Admitting: Emergency Medicine

## 2013-03-30 DIAGNOSIS — Z87891 Personal history of nicotine dependence: Secondary | ICD-10-CM | POA: Insufficient documentation

## 2013-03-30 DIAGNOSIS — Z3202 Encounter for pregnancy test, result negative: Secondary | ICD-10-CM | POA: Insufficient documentation

## 2013-03-30 DIAGNOSIS — L293 Anogenital pruritus, unspecified: Secondary | ICD-10-CM | POA: Insufficient documentation

## 2013-03-30 DIAGNOSIS — N898 Other specified noninflammatory disorders of vagina: Secondary | ICD-10-CM | POA: Insufficient documentation

## 2013-03-30 LAB — URINALYSIS, ROUTINE W REFLEX MICROSCOPIC
Glucose, UA: NEGATIVE mg/dL
Leukocytes, UA: NEGATIVE
Nitrite: NEGATIVE
Specific Gravity, Urine: 1.02 (ref 1.005–1.030)
pH: 6.5 (ref 5.0–8.0)

## 2013-03-30 LAB — POCT PREGNANCY, URINE: Preg Test, Ur: NEGATIVE

## 2013-03-30 NOTE — ED Notes (Signed)
Pt. reports vaginal discharge with itching and bladder pressure / urinary hesitation onset yesterday .

## 2013-03-31 ENCOUNTER — Emergency Department (HOSPITAL_COMMUNITY)
Admission: EM | Admit: 2013-03-31 | Discharge: 2013-03-31 | Discharge status: Against Medical Advice | Payer: Medicaid Other | Attending: Emergency Medicine | Admitting: Emergency Medicine

## 2013-03-31 NOTE — ED Notes (Signed)
Did not answer when name called x2 

## 2013-06-02 ENCOUNTER — Encounter (HOSPITAL_COMMUNITY): Payer: Self-pay | Admitting: Emergency Medicine

## 2013-06-02 ENCOUNTER — Emergency Department (HOSPITAL_COMMUNITY)
Admission: EM | Admit: 2013-06-02 | Discharge: 2013-06-02 | Disposition: A | Discharge status: Home / Self Care | Payer: Medicaid Other | Attending: Emergency Medicine | Admitting: Emergency Medicine

## 2013-06-02 DIAGNOSIS — Z8619 Personal history of other infectious and parasitic diseases: Secondary | ICD-10-CM | POA: Insufficient documentation

## 2013-06-02 DIAGNOSIS — Z3202 Encounter for pregnancy test, result negative: Secondary | ICD-10-CM | POA: Insufficient documentation

## 2013-06-02 DIAGNOSIS — T490X5A Adverse effect of local antifungal, anti-infective and anti-inflammatory drugs, initial encounter: Secondary | ICD-10-CM | POA: Insufficient documentation

## 2013-06-02 DIAGNOSIS — Z87891 Personal history of nicotine dependence: Secondary | ICD-10-CM | POA: Insufficient documentation

## 2013-06-02 DIAGNOSIS — N898 Other specified noninflammatory disorders of vagina: Secondary | ICD-10-CM

## 2013-06-02 LAB — WET PREP, GENITAL: Yeast Wet Prep HPF POC: NONE SEEN

## 2013-06-02 LAB — URINE MICROSCOPIC-ADD ON

## 2013-06-02 LAB — URINALYSIS, ROUTINE W REFLEX MICROSCOPIC
Hgb urine dipstick: NEGATIVE
Nitrite: NEGATIVE
Protein, ur: NEGATIVE mg/dL
Specific Gravity, Urine: 1.025 (ref 1.005–1.030)
Urobilinogen, UA: 1 mg/dL (ref 0.0–1.0)

## 2013-06-02 MED ORDER — CEFTRIAXONE SODIUM 250 MG IJ SOLR
250.0000 mg | Freq: Once | INTRAMUSCULAR | Status: AC
  Administered 2013-06-02: 250 mg via INTRAMUSCULAR
  Filled 2013-06-02: qty 250

## 2013-06-02 MED ORDER — AZITHROMYCIN 250 MG PO TABS
1000.0000 mg | ORAL_TABLET | Freq: Once | ORAL | Status: AC
  Administered 2013-06-02: 1000 mg via ORAL
  Filled 2013-06-02: qty 4

## 2013-06-02 NOTE — ED Notes (Signed)
Pt undressed, in gown; pelvic cart set up at bedside 

## 2013-06-02 NOTE — ED Notes (Signed)
Pt c/o severe itching and burning immediately after using monistat.  States she made it worse by trying to remove the medication with a cloth.

## 2013-06-02 NOTE — ED Provider Notes (Signed)
CSN: [id number]     Arrival date & time 06/02/13  4098 History   First MD Initiated Contact with Patient 06/02/13 [phone]     Chief Complaint  Patient presents with  . vag itching after monistat    (Consider location/radiation/quality/duration/timing/severity/associated sxs/prior Treatment) HPI  32 year old female with prior STDs including Trichomonas and Chlamydia presents for evaluations of vaginal discomfort. Patient states 5 days ago she developed vaginal discharge, she was seen at a [facility]. Sts she had an exam and was diagnosed with having yeast infection. Reports she normally developed yeast infection every 2 months and usually received oral medication as treatment. However, she was given Monistat cream to use this time instead. After using the cream for couple days she developed itchiness burning sensation in her vagina. 2 days ago she uses a cloth to clean out the cream and her discomfort has improved however she continues to endorse vaginal discharge. No complaints of fever chills, abdominal pain, dysuria, hematuria, rash, or vaginal bleeding. She has had 2 sexual partners within the past 6 months using protection every time. Her last menstrual period was November 21.  Past Medical History  Diagnosis Date  . Hypertension during pregnancy    Past Surgical History  Procedure Laterality Date  . Circlage    . Tubal ligation     No family history on file. History  Substance Use Topics  . Smoking status: Former Games developer  . Smokeless tobacco: Former Neurosurgeon    Quit date: 07/19/2011  . Alcohol Use: Yes     Comment: 10   OB History   Grav Para Term Preterm Abortions TAB SAB Ect Mult Living                 Review of Systems  Constitutional: Negative for fever.  Gastrointestinal: Negative for abdominal pain.  Genitourinary: Positive for vaginal discharge. Negative for dysuria and pelvic pain.  Skin: Negative for rash.    Allergies  Review of patient's allergies  indicates no known allergies.  Home Medications   Current Outpatient Rx  Name  Route  Sig  Dispense  Refill  . fluconazole (DIFLUCAN) 150 MG tablet   Oral   Take 150 mg by mouth once.          BP 112/61  Pulse 70  Temp(Src) 98.6 F (37 C) (Oral)  Resp 18  Ht 5\' 7"  (1.702 m)  Wt 164 lb (74.39 kg)  BMI 25.68 kg/m2  SpO2 100%  LMP 05/02/2013 Physical Exam  Nursing note and vitals reviewed. Constitutional: She appears well-developed and well-nourished. No distress.  HENT:  Head: Normocephalic and atraumatic.  Eyes: Conjunctivae are normal.  Neck: Normal range of motion. Neck supple.  Cardiovascular: Normal rate and regular rhythm.   Pulmonary/Chest: Effort normal and breath sounds normal. She exhibits no tenderness.  Abdominal: Soft. There is no tenderness.  Genitourinary: Uterus normal. There is no rash or lesion on the right labia. There is no rash or lesion on the left labia. Cervix exhibits no motion tenderness and no discharge. Right adnexum displays no mass and no tenderness. Left adnexum displays no mass and no tenderness. No erythema, tenderness or bleeding around the vagina. No foreign body around the vagina. Vaginal discharge (moderate white curd-like discharge in vaginal vault) found.  Chaperone present:  Lymphadenopathy:       Right: No inguinal adenopathy present.       Left: No inguinal adenopathy present.  Neurological: She is alert.  Skin: No rash noted.  Psychiatric: She has a normal mood and affect.    ED Course  Procedures (including critical care time)  10:21 AM Pt with vaginal discharge. Pelvic exam performed.  Wet prep shows many WBC however pt without adnexal tenderness suggestive of PID.  Due to prior hx of STD, will give rocephin/zithromax abx.    10:41 AM UA neg for UTI, is afebrile, VSS.  No abd pain, no evidence of PID, preg test neg.  Pt stable for d/c.  Cultures sent.    Labs Review Labs Reviewed  WET PREP, GENITAL - Abnormal; Notable  for the following:    WBC, Wet Prep HPF POC MANY (*)    All other components within normal limits  URINALYSIS, ROUTINE W REFLEX MICROSCOPIC - Abnormal; Notable for the following:    APPearance CLOUDY (*)    Leukocytes, UA MODERATE (*)    All other components within normal limits  URINE MICROSCOPIC-ADD ON - Abnormal; Notable for the following:    Squamous Epithelial / LPF FEW (*)    Bacteria, UA FEW (*)    All other components within normal limits  GC/CHLAMYDIA PROBE AMP  POCT PREGNANCY, URINE   Imaging Review No results found.  EKG Interpretation   None       MDM   1. Vaginal discharge    BP 108/62  Pulse 62  Temp(Src) 98.6 F (37 C) (Oral)  Resp 18  Ht 5\' 7"  (1.702 m)  Wt 164 lb (74.39 kg)  BMI 25.68 kg/m2  SpO2 100%  LMP 05/02/2013     Fayrene Helper, PA-C 06/02/13 1042

## 2013-06-02 NOTE — ED Notes (Signed)
Pt ambulated to restroom. Given urine specimen cup 

## 2013-06-05 LAB — GC/CHLAMYDIA PROBE AMP: CT Probe RNA: NEGATIVE

## 2013-06-05 NOTE — ED Provider Notes (Signed)
Medical screening examination/treatment/procedure(s) were performed by non-physician practitioner and as supervising physician I was immediately available for consultation/collaboration.  EKG Interpretation   None         Cole Klugh M Elyanna Wallick, DO 06/05/13 0839 

## 2014-04-04 ENCOUNTER — Emergency Department (HOSPITAL_COMMUNITY)
Admission: EM | Admit: 2014-04-04 | Discharge: 2014-04-04 | Disposition: A | Discharge status: Home / Self Care | Payer: Medicaid Other | Attending: Emergency Medicine | Admitting: Emergency Medicine

## 2014-04-04 ENCOUNTER — Encounter (HOSPITAL_COMMUNITY): Payer: Self-pay | Admitting: Emergency Medicine

## 2014-04-04 DIAGNOSIS — R5381 Other malaise: Secondary | ICD-10-CM | POA: Diagnosis not present

## 2014-04-04 DIAGNOSIS — R5383 Other fatigue: Secondary | ICD-10-CM | POA: Diagnosis not present

## 2014-04-04 DIAGNOSIS — Z87891 Personal history of nicotine dependence: Secondary | ICD-10-CM | POA: Diagnosis not present

## 2014-04-04 DIAGNOSIS — J3489 Other specified disorders of nose and nasal sinuses: Secondary | ICD-10-CM | POA: Diagnosis present

## 2014-04-04 DIAGNOSIS — R197 Diarrhea, unspecified: Secondary | ICD-10-CM | POA: Insufficient documentation

## 2014-04-04 DIAGNOSIS — R110 Nausea: Secondary | ICD-10-CM | POA: Diagnosis not present

## 2014-04-04 DIAGNOSIS — J069 Acute upper respiratory infection, unspecified: Secondary | ICD-10-CM | POA: Insufficient documentation

## 2014-04-04 DIAGNOSIS — IMO0001 Reserved for inherently not codable concepts without codable children: Secondary | ICD-10-CM | POA: Insufficient documentation

## 2014-04-04 MED ORDER — ACETAMINOPHEN 325 MG PO TABS
650.0000 mg | ORAL_TABLET | Freq: Once | ORAL | Status: DC
  Filled 2014-04-04: qty 2

## 2014-04-04 MED ORDER — GUAIFENESIN 100 MG/5ML PO LIQD: 100.0000 mg | ORAL | Status: DC | PRN

## 2014-04-04 NOTE — ED Notes (Addendum)
PT stats yesterday had a cough and congestion and sore throat; today having body aches. Sore throat has gone and mild congestion. States one minute hot and one minute cold. Took tramadol for body aches and were able to get rest.

## 2014-04-04 NOTE — ED Provider Notes (Signed)
CSN: [id number]     Arrival date & time 04/04/14  1928 History  This chart was scribed for non-physician practitioner, Junius Finner, PA-C,working with Richardean Canal, MD, by Karle Plumber, ED Scribe. This patient was seen in room TR06C/TR06C and the patient's care was started at 8:03 PM.  Chief Complaint  Patient presents with  . Generalized Body Aches  . Nasal Congestion   The history is provided by the patient. No language interpreter was used.   HPI Comments:  Destiny Wallace is a 33 y.o. female who presents to the Emergency Department complaining of nasal congestion, diarrhea and fatigue that started yesterday. She reports five episodes of loose stools today. She reports associated chills. Reports sore throat yesterday that has resolved on its own. She has been taking DayQuil, Tylenol and Tramadol for her symptoms, which have helped. She denies otalgia, fever, abdominal pain or vomiting. She does not have a PCP. Her LMP was two weeks ago. Denies difficulty breathing or chest pain.  Past Medical History  Diagnosis Date  . Hypertension during pregnancy    Past Surgical History  Procedure Laterality Date  . Circlage    . Tubal ligation     History reviewed. No pertinent family history. History  Substance Use Topics  . Smoking status: Former Games developer  . Smokeless tobacco: Former Neurosurgeon    Quit date: 07/19/2011  . Alcohol Use: Yes     Comment: 10   OB History   Grav Para Term Preterm Abortions TAB SAB Ect Mult Living                 Review of Systems  Constitutional: Positive for chills. Negative for fever.  HENT: Positive for congestion. Negative for ear pain and sore throat.   Gastrointestinal: Positive for nausea and diarrhea. Negative for vomiting.  Musculoskeletal: Positive for myalgias.  All other systems reviewed and are negative.   Allergies  Review of patient's allergies indicates no known allergies.  Home Medications   Prior to Admission medications   Medication  Sig Start Date End Date Taking? Authorizing Provider  acetaminophen (TYLENOL) 500 MG tablet Take 1,000 mg by mouth every 6 (six) hours as needed for mild pain.   Yes Historical Provider, MD  Pseudoephedrine-APAP-DM (DAYQUIL PO) Take 2 capsules by mouth daily as needed (for cough/cold symptoms).   Yes Historical Provider, MD  traMADol (ULTRAM) 50 MG tablet Take 50 mg by mouth every 6 (six) hours as needed.   Yes Historical Provider, MD  guaiFENesin (ROBITUSSIN) 100 MG/5ML liquid Take 5-10 mLs (100-200 mg total) by mouth every 4 (four) hours as needed for cough. 04/04/14   Junius Finner, PA-C   Triage Vitals: BP 122/78  Pulse 84  Temp(Src) 98.8 F (37.1 C) (Oral)  Resp 20  Ht  (1.702 m)  Wt 187 lb (84.823 kg)  BMI 29.28 kg/m2  SpO2 99%  LMP 03/20/2014 Physical Exam  Nursing note and vitals reviewed. Constitutional: She is oriented to person, place, and time. She appears well-developed and well-nourished.  HENT:  Head: Normocephalic and atraumatic.  Right Ear: Tympanic membrane and external ear normal.  Left Ear: Tympanic membrane normal.  Mouth/Throat: Uvula is midline, oropharynx is clear and moist and mucous membranes are normal.  Nasal congestion noted.  Eyes: EOM are normal.  Neck: Normal range of motion.  Cardiovascular: Normal rate, regular rhythm and normal heart sounds.  Exam reveals no gallop and no friction rub.   No murmur heard. Pulmonary/Chest: Effort normal and  breath sounds normal. No respiratory distress. She has no wheezes. She has no rales.  Musculoskeletal: Normal range of motion.  Neurological: She is alert and oriented to person, place, and time.  Skin: Skin is warm and dry.  Psychiatric: She has a normal mood and affect. Her behavior is normal.    ED Course  Procedures (including critical care time) DIAGNOSTIC STUDIES: Oxygen Saturation is 99% on RA, normal by my interpretation.   COORDINATION OF CARE: 8:07 PM- Advised pt to continue with Tylenol or  Motrin for the body aches. Pt verbalizes understanding and agrees to plan.  Medications - No data to display  Labs Review Labs Reviewed - No data to display  Imaging Review No results found.   EKG Interpretation None      MDM   Final diagnoses:  URI (upper respiratory infection)    Pt is a 33yo female c/o URI type symptoms that started yesterday. No respiratory distress. Vitals: WNL. Lungs: CTAB. Will tx symptomatically for URI.  Advised to take acetaminophen and ibuprofen for fever and pain. Also advised to stay well hydrated and get plenty of rest. Advised to f/u with PCP in 1 week if symptoms not improving. Return precautions provided. Pt verbalized understanding and agreement with tx plan.   I personally performed the services described in this documentation, which was scribed in my presence. The recorded information has been reviewed and is accurate.    Junius Finner, PA-C 04/04/14 2023

## 2014-04-04 NOTE — Discharge Instructions (Signed)
Take 400-600mg Ibuprofen (Motrin) every 6-8 hours for fever and pain  °Alternate with Tylenol  °Take 500-1000mg Tylenol every 4-6 hours as needed for fever and pain  °Follow-up with your primary care provider next week for recheck of symptoms if not improving.  °Be sure to drink plenty of fluids and rest, at least 8hrs of sleep a night, preferably more while you are sick. °Return to the ED if you cannot keep down fluids/signs of dehydration, fever not reducing with Tylenol, difficulty breathing/wheezing, stiff neck, worsening condition, or other concerns (see below)  ° °

## 2014-04-04 NOTE — ED Provider Notes (Signed)
Medical screening examination/treatment/procedure(s) were performed by non-physician practitioner and as supervising physician I was immediately available for consultation/collaboration.   EKG Interpretation None        David H Yao, MD 04/04/14 2349 

## 2016-10-31 ENCOUNTER — Emergency Department (HOSPITAL_COMMUNITY)
Admission: EM | Admit: 2016-10-31 | Discharge: 2016-10-31 | Disposition: A | Discharge status: Home / Self Care | Payer: Medicaid Other | Attending: Emergency Medicine | Admitting: Emergency Medicine

## 2016-10-31 ENCOUNTER — Encounter (HOSPITAL_COMMUNITY): Payer: Self-pay

## 2016-10-31 DIAGNOSIS — M546 Pain in thoracic spine: Secondary | ICD-10-CM | POA: Diagnosis not present

## 2016-10-31 DIAGNOSIS — Z87891 Personal history of nicotine dependence: Secondary | ICD-10-CM | POA: Insufficient documentation

## 2016-10-31 DIAGNOSIS — G8929 Other chronic pain: Secondary | ICD-10-CM

## 2016-10-31 DIAGNOSIS — M25511 Pain in right shoulder: Secondary | ICD-10-CM | POA: Insufficient documentation

## 2016-10-31 MED ORDER — CYCLOBENZAPRINE HCL 10 MG PO TABS: 10.0000 mg | ORAL_TABLET | Freq: Two times a day (BID) | ORAL | 0 refills | Status: DC | PRN

## 2016-10-31 MED ORDER — IBUPROFEN 800 MG PO TABS: 800.0000 mg | ORAL_TABLET | Freq: Three times a day (TID) | ORAL | 0 refills | Status: DC

## 2016-10-31 NOTE — ED Provider Notes (Signed)
[facility]-EMERGENCY DEPT Provider Note   CSN: [id number] Arrival date & time: 10/31/16  0840  By signing my name below, I, Majel Homer, attest that this documentation has been prepared under the direction and in the presence of Lyndel Safe, [place]. Electronically Signed: Majel Homer, Scribe. 10/31/2016. 11:03 AM.  History   Chief Complaint No chief complaint on file.  The history is provided by the patient. No language interpreter was used.   HPI Comments: Destiny Wallace is a 36 y.o. female who presents to the Emergency Department complaining of intermittent, 7/10, right shoulder pain radiating into her mid-back that began ~2 weeks ago. Pt describes her back pain as sharp and states hx of chronic back pain that began ~3 years ago. 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 She states she took Tylenol for her pain this morning with no relief. Pt denies any numbness or weakness in her extremities, saddle anaesthesia, difficulty urinating, urinary or bowel incontinence, and hx of IV drug use.   Past Medical History:  Diagnosis Date  . Hypertension during pregnancy    There are no active problems to display for this patient.  Past Surgical History:  Procedure Laterality Date  . CIRCLAGE    . TUBAL LIGATION      OB History    No data available     Home Medications    Prior to Admission medications   Medication Sig Start Date End Date Taking? Authorizing Provider  acetaminophen (TYLENOL) 500 MG tablet Take 1,000 mg by mouth every 6 (six) hours as needed for mild pain.    Historical Provider, MD  cyclobenzaprine (FLEXERIL) 10 MG tablet Take 1 tablet (10 mg total) by mouth 2 (two) times daily as needed for muscle spasms. 10/31/16   Melene Plan, DO  guaiFENesin (ROBITUSSIN) 100 MG/5ML liquid Take 5-10 mLs (100-200 mg total) by mouth every 4 (four) hours as needed for cough. 04/04/14    Junius Finner, PA-C  ibuprofen (ADVIL,MOTRIN) 800 MG tablet Take 1 tablet (800 mg total) by mouth 3 (three) times daily. 10/31/16   Melene Plan, DO  Pseudoephedrine-APAP-DM (DAYQUIL PO) Take 2 capsules by mouth daily as needed (for cough/cold symptoms).    Historical Provider, MD  traMADol (ULTRAM) 50 MG tablet Take 50 mg by mouth every 6 (six) hours as needed.    Historical Provider, MD   Family History No family history on file.  Social History Social History  Substance Use Topics  . Smoking status: Former Games developer  . Smokeless tobacco: Former Neurosurgeon    Quit date: 07/19/2011  . Alcohol use Yes     Comment: 10   Allergies   Patient has no known allergies.  Review of Systems Review of Systems  Constitutional: Negative for chills, diaphoresis, fever and unexpected weight change.  Genitourinary: Negative for decreased urine volume, difficulty urinating, dysuria, flank pain, frequency and urgency.  Musculoskeletal: Positive for back pain and myalgias. Negative for gait problem and joint swelling.  Neurological: Negative for weakness and numbness.   Physical Exam Updated Vital Signs BP 115/81   Pulse 62   Temp 97.5 F (36.4 C) (Oral)   Resp 18   SpO2 100%   Physical Exam  Constitutional: She appears well-developed and well-nourished. No distress.  HENT:  Head: Normocephalic and atraumatic.  Eyes: Conjunctivae are normal. Right eye exhibits no discharge. Left eye exhibits  no discharge. No scleral icterus.  Neck: Normal range of motion.  Cardiovascular: Normal rate.   Pulmonary/Chest: Effort normal. No stridor. No respiratory distress.  Abdominal: She exhibits no distension.  Musculoskeletal: Normal range of motion. She exhibits tenderness. She exhibits no edema or deformity.  Lower thoracic paraspinal muscles are tight, tender, and in spasm bilaterally.  Spasm to her right trapezius muscle that is TTP, good ROM.  Good sensation, motor and circulatory function to all 4 extremities    Neurological: She is alert. She exhibits normal muscle tone.  Skin: Skin is warm and dry. She is not diaphoretic.  Psychiatric: She has a normal mood and affect. Her behavior is normal.  Nursing note and vitals reviewed.  ED Treatments / Results  DIAGNOSTIC STUDIES:  Oxygen Saturation is 100% on RA, normal by my interpretation.    COORDINATION OF CARE:  10:56 AM Discussed treatment plan with pt at bedside which includes Flexeril and pt agreed to plan.  Labs (all labs ordered are listed, but only abnormal results are displayed) Labs Reviewed - No data to display  EKG  EKG Interpretation None       Radiology No results found.  Procedures Procedures (including critical care time)  Medications Ordered in ED Medications - No data to display  Initial Impression / Assessment and Plan / ED Course  I have reviewed the triage vital signs and the nursing notes.  Pertinent labs & imaging results that were available during my care of the patient were reviewed by me and considered in my medical decision making (see chart for details).    Patient with back pain.  No neurological deficits and normal neuro exam.  Patient is ambulatory.  No loss of bowel or bladder control.  No concern for cauda equina.  No fever, night sweats, weight loss, h/o cancer, IVDA, no recent procedure to back. No urinary symptoms suggestive of UTI.  Supportive care and return precaution discussed. Additional pain in right trapezius with palpated muscle spasm. Appears safe for discharge at this time. Follow up as indicated in discharge paperwork. She will be given Rx for flexeril and motrin with instructions not to drive, operate heavy machinery etc for 16-24 hours after taking flexeril.  I personally performed the services described in this documentation, which was scribed in my presence. The recorded information has been reviewed and is accurate.   Final Clinical Impressions(s) / ED Diagnoses   Final  diagnoses:  Chronic right shoulder pain  Bilateral thoracic back pain, unspecified chronicity    New Prescriptions Discharge Medication List as of 10/31/2016 12:38 PM    START taking these medications   Details  cyclobenzaprine (FLEXERIL) 10 MG tablet Take 1 tablet (10 mg total) by mouth 2 (two) times daily as needed for muscle spasms., Starting Sat 10/31/2016, Print    ibuprofen (ADVIL,MOTRIN) 800 MG tablet Take 1 tablet (800 mg total) by mouth 3 (three) times daily., Starting Sat 10/31/2016, Print         Cristina Gong, PA-C 10/31/16 1830    Melene Plan, DO 11/01/16 [phone]

## 2016-10-31 NOTE — ED Triage Notes (Signed)
Patient complains of right shoulder pain with ROM of motion for weeks/months and now has spinal pain with movement. Denies injury, NAD

## 2016-10-31 NOTE — ED Notes (Signed)
ED Provider at bedside. 

## 2017-08-28 ENCOUNTER — Emergency Department (HOSPITAL_COMMUNITY)
Admission: EM | Admit: 2017-08-28 | Discharge: 2017-08-28 | Disposition: A | Discharge status: Home / Self Care | Payer: Self-pay | Attending: Emergency Medicine | Admitting: Emergency Medicine

## 2017-08-28 ENCOUNTER — Other Ambulatory Visit: Payer: Self-pay

## 2017-08-28 ENCOUNTER — Encounter (HOSPITAL_COMMUNITY): Payer: Self-pay | Admitting: Emergency Medicine

## 2017-08-28 DIAGNOSIS — R112 Nausea with vomiting, unspecified: Secondary | ICD-10-CM | POA: Insufficient documentation

## 2017-08-28 DIAGNOSIS — R1030 Lower abdominal pain, unspecified: Secondary | ICD-10-CM | POA: Insufficient documentation

## 2017-08-28 DIAGNOSIS — Z87891 Personal history of nicotine dependence: Secondary | ICD-10-CM | POA: Insufficient documentation

## 2017-08-28 LAB — LIPASE, BLOOD: LIPASE: 29 U/L (ref 11–51)

## 2017-08-28 LAB — CBC WITH DIFFERENTIAL/PLATELET
BASOS ABS: 0 10*3/uL (ref 0.0–0.1)
Basophils Relative: 0 %
Eosinophils Absolute: 0 10*3/uL (ref 0.0–0.7)
Eosinophils Relative: 0 %
HEMATOCRIT: 33.3 % — AB (ref 36.0–46.0)
Hemoglobin: 10.5 g/dL — ABNORMAL LOW (ref 12.0–15.0)
LYMPHS ABS: 0.5 10*3/uL — AB (ref 0.7–4.0)
LYMPHS PCT: 7 %
MCH: 24.1 pg — AB (ref 26.0–34.0)
MCHC: 31.5 g/dL (ref 30.0–36.0)
MCV: 76.6 fL — AB (ref 78.0–100.0)
MONOS PCT: 7 %
Monocytes Absolute: 0.4 10*3/uL (ref 0.1–1.0)
NEUTROS ABS: 5.5 10*3/uL (ref 1.7–7.7)
Neutrophils Relative %: 86 %
Platelets: 245 10*3/uL (ref 150–400)
RBC: 4.35 MIL/uL (ref 3.87–5.11)
RDW: 17.9 % — ABNORMAL HIGH (ref 11.5–15.5)
WBC: 6.4 10*3/uL (ref 4.0–10.5)

## 2017-08-28 LAB — COMPREHENSIVE METABOLIC PANEL
ALBUMIN: 4.4 g/dL (ref 3.5–5.0)
ALT: 13 U/L — ABNORMAL LOW (ref 14–54)
ANION GAP: 11 (ref 5–15)
AST: 25 U/L (ref 15–41)
Alkaline Phosphatase: 53 U/L (ref 38–126)
BILIRUBIN TOTAL: 0.3 mg/dL (ref 0.3–1.2)
BUN: 8 mg/dL (ref 6–20)
CHLORIDE: 103 mmol/L (ref 101–111)
CO2: 22 mmol/L (ref 22–32)
Calcium: 9.5 mg/dL (ref 8.9–10.3)
Creatinine, Ser: 0.65 mg/dL (ref 0.44–1.00)
GFR calc Af Amer: >60 mL/min (ref >60)
Glucose, Bld: 116 mg/dL — ABNORMAL HIGH (ref 65–99)
POTASSIUM: 3.1 mmol/L — AB (ref 3.5–5.1)
Sodium: 136 mmol/L (ref 135–145)
TOTAL PROTEIN: 7.3 g/dL (ref 6.5–8.1)

## 2017-08-28 LAB — URINALYSIS, ROUTINE W REFLEX MICROSCOPIC
Bilirubin Urine: NEGATIVE
GLUCOSE, UA: NEGATIVE mg/dL
HGB URINE DIPSTICK: NEGATIVE
KETONES UR: NEGATIVE mg/dL
LEUKOCYTES UA: NEGATIVE
Nitrite: NEGATIVE
PROTEIN: NEGATIVE mg/dL
Specific Gravity, Urine: 1.015 (ref 1.005–1.030)
pH: 9 — ABNORMAL HIGH (ref 5.0–8.0)

## 2017-08-28 LAB — POC URINE PREG, ED: PREG TEST UR: NEGATIVE

## 2017-08-28 MED ORDER — DICYCLOMINE HCL 10 MG/ML IM SOLN
20.0000 mg | Freq: Once | INTRAMUSCULAR | Status: AC
  Administered 2017-08-28: 20 mg via INTRAMUSCULAR
  Filled 2017-08-28: qty 2

## 2017-08-28 MED ORDER — ONDANSETRON HCL 4 MG/2ML IJ SOLN
4.0000 mg | Freq: Once | INTRAMUSCULAR | Status: AC
  Administered 2017-08-28: 4 mg via INTRAVENOUS
  Filled 2017-08-28: qty 2

## 2017-08-28 MED ORDER — DICYCLOMINE HCL 20 MG PO TABS: 20.0000 mg | ORAL_TABLET | Freq: Two times a day (BID) | ORAL | 0 refills | Status: DC | PRN

## 2017-08-28 MED ORDER — SODIUM CHLORIDE 0.9 % IV BOLUS (SEPSIS)
1000.0000 mL | Freq: Once | INTRAVENOUS | Status: AC
  Administered 2017-08-28: 1000 mL via INTRAVENOUS

## 2017-08-28 MED ORDER — ONDANSETRON 4 MG PO TBDP: 4.0000 mg | ORAL_TABLET | Freq: Three times a day (TID) | ORAL | 0 refills | Status: DC | PRN

## 2017-08-28 MED ORDER — POTASSIUM CHLORIDE CRYS ER 20 MEQ PO TBCR
40.0000 meq | EXTENDED_RELEASE_TABLET | Freq: Once | ORAL | Status: AC
  Administered 2017-08-28: 40 meq via ORAL
  Filled 2017-08-28: qty 2

## 2017-08-28 NOTE — ED Provider Notes (Signed)
[facility]-EMERGENCY DEPT Provider Note   CSN: [id number] Arrival date & time: 08/28/17  4098     History   Chief Complaint Chief Complaint  Patient presents with  . Emesis    HPI Destiny Wallace is a 37 y.o. female.  The history is provided by the patient and medical records. No language interpreter was used.   Destiny Wallace is a 37 y.o. female who presents to the Emergency Department complaining of acute onset of crampy bilateral lower abdominal pain.  Associated symptoms include nausea and 5-6 episodes of emesis.  No medications taken prior to arrival for symptoms.  Denies history of similar sxs.  No fever, chills, vaginal discharge, urinary symptoms, diarrhea, constipation, back pain.  Denies sick contacts or new food exposures.   Past Medical History:  Diagnosis Date  . Hypertension during pregnancy     There are no active problems to display for this patient.   Past Surgical History:  Procedure Laterality Date  . CIRCLAGE    . TUBAL LIGATION      OB History    No data available       Home Medications    Prior to Admission medications   Medication Sig Start Date End Date Taking? Authorizing Provider  acetaminophen (TYLENOL) 500 MG tablet Take 1,000 mg by mouth every 6 (six) hours as needed for mild pain.   Yes [provider]  cyclobenzaprine (FLEXERIL) 10 MG tablet Take 1 tablet (10 mg total) by mouth 2 (two) times daily as needed for muscle spasms. Patient not taking: Reported on 08/28/2017 10/31/16   Melene Plan, DO  dicyclomine (BENTYL) 20 MG tablet Take 1 tablet (20 mg total) by mouth 2 (two) times daily as needed (abdominal cramping). 08/28/17   Ward, Chase Picket, PA-C  guaiFENesin (ROBITUSSIN) 100 MG/5ML liquid Take 5-10 mLs (100-200 mg total) by mouth every 4 (four) hours as needed for cough. Patient not taking: Reported on 08/28/2017 04/04/14   Lurene Shadow, PA-C  ibuprofen (ADVIL,MOTRIN) 800 MG tablet Take 1 tablet  (800 mg total) by mouth 3 (three) times daily. Patient not taking: Reported on 08/28/2017 10/31/16   Melene Plan, DO  ondansetron (ZOFRAN ODT) 4 MG disintegrating tablet Take 1 tablet (4 mg total) by mouth every 8 (eight) hours as needed for nausea or vomiting. 08/28/17   Ward, Chase Picket, PA-C    Family History History reviewed. No pertinent family history.  Social History Social History   Tobacco Use  . Smoking status: Former Games developer  . Smokeless tobacco: Former Neurosurgeon    Quit date: 07/19/2011  Substance Use Topics  . Alcohol use: Yes    Comment: 10  . Drug use: No      Allergies   Patient has no known allergies.   Review of Systems Review of Systems  Gastrointestinal: Positive for abdominal pain, nausea and vomiting. Negative for blood in stool, constipation and diarrhea.  All other systems reviewed and are negative.    Physical Exam Updated Vital Signs BP (!) 149/78 (BP Location: Left Arm)   Pulse 61   Temp 98.5 F (36.9 C) (Oral)   Resp 18   SpO2 100%   Physical Exam  Constitutional: She is oriented to person, place, and time. She appears well-developed and well-nourished. No distress.  HENT:  Head: Normocephalic and atraumatic.  Neck: Neck supple.  Cardiovascular: Normal rate, regular rhythm and normal heart sounds.  No murmur heard. Pulmonary/Chest: Effort normal and breath sounds normal. No  respiratory distress.  Abdominal: Soft. She exhibits no distension.  Diffuse lower abdominal pain without focality. No focal tenderness at McBurney's point.  Neurological: She is alert and oriented to person, place, and time.  Skin: Skin is warm and dry.  Nursing note and vitals reviewed.   ED Treatments / Results  Labs (all labs ordered are listed, but only abnormal results are displayed) Labs Reviewed  CBC WITH DIFFERENTIAL/PLATELET - Abnormal; Notable for the following components:      Result Value   Hemoglobin 10.5 (*)    HCT 33.3 (*)    MCV 76.6 (*)    MCH  24.1 (*)    RDW 17.9 (*)    Lymphs Abs 0.5 (*)    All other components within normal limits  URINALYSIS, ROUTINE W REFLEX MICROSCOPIC - Abnormal; Notable for the following components:   APPearance CLOUDY (*)    pH 9.0 (*)    All other components within normal limits  COMPREHENSIVE METABOLIC PANEL - Abnormal; Notable for the following components:   Potassium 3.1 (*)    Glucose, Bld 116 (*)    ALT 13 (*)    All other components within normal limits  LIPASE, BLOOD  POC URINE PREG, ED    EKG  EKG Interpretation None       Radiology No results found.  Procedures Procedures (including critical care time)  Medications Ordered in ED Medications  ondansetron (ZOFRAN) injection 4 mg (4 mg Intravenous Given 08/28/17 0756)  sodium chloride 0.9 % bolus 1,000 mL (0 mLs Intravenous Stopped 08/28/17 0924)  dicyclomine (BENTYL) injection 20 mg (20 mg Intramuscular Given 08/28/17 0757)  potassium chloride SA (K-DUR,KLOR-CON) CR tablet 40 mEq (40 mEq Oral Given 08/28/17 0924)     Initial Impression / Assessment and Plan / ED Course  I have reviewed the triage vital signs and the nursing notes.  Pertinent labs & imaging results that were available during my care of the patient were reviewed by me and considered in my medical decision making (see chart for details).    Destiny Wallace is a 37 y.o. female who presents to ED for acute onset of lower abdominal pain, n/v which began this morning. On exam, patient is afebrile, non-toxic appearing with a non-surgical abdominal exam.  Does have diffuse tenderness across the lower abdomen.  No focal tenderness at McBurney's point. IV fluids, Zofran and Bentyl given.    Labs reviewed and reassuring.  She is hypokalemic at 3.1 which was replenished in the ED.  Baseline anemia noted.  Normal white count.  UA with no signs of infection or blood noted.  8:29 AM - Patient reevaluated.  She feels improved after medications.  Repeat abdominal exam now with  no tenderness.  Will p.o. challenge with likely discharged home.  Patient tolerating PO with no further episodes of emesis since medication administration.   Evaluation does not show pathology that would require ongoing emergent intervention or inpatient treatment. Rx for zofran and bentyl given. PCP follow up encouraged. Spoke at length with patient about signs or symptoms that should prompt return to emergency Department including inability to tolerate PO, blood in the stools, fevers, focal localization of abdominal pain, new/worsening symptoms or any additional concerns. Patient understands diagnosis and plan of care as dictated above. All questions answered.  Final Clinical Impressions(s) / ED Diagnoses   Final diagnoses:  Non-intractable vomiting with nausea, unspecified vomiting type    ED Discharge Orders        Ordered  ondansetron (ZOFRAN ODT) 4 MG disintegrating tablet  Every 8 hours PRN     08/28/17 1007    dicyclomine (BENTYL) 20 MG tablet  2 times daily PRN     08/28/17 1007       Ward, Chase Picket, PA-C 08/28/17 1009    Vanetta Mulders, MD 08/29/17 [phone]

## 2017-08-28 NOTE — ED Triage Notes (Signed)
Pt brought in by [facility] from home with c/o nausea and vomiting, onset last night.  Pt reports that she has been vomiting over night--- denies abdominal pain.

## 2017-08-28 NOTE — ED Notes (Signed)
Bed: [id number] Expected date:  Expected time:  Means of arrival:  Comments: 37 yo F  Nausea and vomiting

## 2017-08-28 NOTE — Discharge Instructions (Signed)
Follow up with your primary care doctor to discuss your hospital visit. Continue to hydrate orally with small sips of fluids throughout the day. Use Zofran as directed for nausea & vomiting. Bentyl as needed for abdominal pain.   The 'BRAT' diet is suggested, then progress to diet as tolerated as symptoms abate.  Bananas.  Rice.  Applesauce.  Toast (and other simple starches such as crackers, potatoes, noodles).   SEEK IMMEDIATE MEDICAL ATTENTION IF: You begin having localized abdominal pain that does not go away or becomes severe A temperature above 101 develops Repeated vomiting occurs (multiple uncontrollable episodes) or you are unable to keep fluids down Blood is being passed in stools or vomit (bright red or black tarry stools).  New or worsening symptoms develop, any additional concerns.

## 2017-08-28 NOTE — ED Notes (Signed)
Patient requesting to use the restroom. However, she is refusing to use the bedpan and refusing to get out of bed to walk to the restroom as she did earlier with no problems. Will continue to check if patient needs restroom assistance.

## 2017-08-28 NOTE — ED Notes (Signed)
Pt refused to wear gown.   

## 2017-08-29 ENCOUNTER — Encounter (HOSPITAL_COMMUNITY): Payer: Self-pay

## 2017-08-29 ENCOUNTER — Other Ambulatory Visit: Payer: Self-pay

## 2017-08-29 ENCOUNTER — Emergency Department (HOSPITAL_COMMUNITY)
Admission: EM | Admit: 2017-08-29 | Discharge: 2017-08-29 | Discharge status: Against Medical Advice | Payer: Medicaid Other | Attending: Emergency Medicine | Admitting: Emergency Medicine

## 2017-08-29 ENCOUNTER — Encounter (HOSPITAL_COMMUNITY): Payer: Self-pay | Admitting: Emergency Medicine

## 2017-08-29 DIAGNOSIS — R509 Fever, unspecified: Secondary | ICD-10-CM | POA: Insufficient documentation

## 2017-08-29 DIAGNOSIS — Z87891 Personal history of nicotine dependence: Secondary | ICD-10-CM | POA: Insufficient documentation

## 2017-08-29 DIAGNOSIS — Z5321 Procedure and treatment not carried out due to patient leaving prior to being seen by health care provider: Secondary | ICD-10-CM | POA: Insufficient documentation

## 2017-08-29 DIAGNOSIS — R109 Unspecified abdominal pain: Secondary | ICD-10-CM | POA: Insufficient documentation

## 2017-08-29 DIAGNOSIS — R197 Diarrhea, unspecified: Secondary | ICD-10-CM | POA: Insufficient documentation

## 2017-08-29 DIAGNOSIS — D509 Iron deficiency anemia, unspecified: Secondary | ICD-10-CM | POA: Insufficient documentation

## 2017-08-29 DIAGNOSIS — R112 Nausea with vomiting, unspecified: Secondary | ICD-10-CM | POA: Insufficient documentation

## 2017-08-29 DIAGNOSIS — M791 Myalgia, unspecified site: Secondary | ICD-10-CM | POA: Insufficient documentation

## 2017-08-29 LAB — COMPREHENSIVE METABOLIC PANEL
ALBUMIN: 3.9 g/dL (ref 3.5–5.0)
ALK PHOS: 54 U/L (ref 38–126)
ALT: 20 U/L (ref 14–54)
ANION GAP: 11 (ref 5–15)
AST: 37 U/L (ref 15–41)
BILIRUBIN TOTAL: 0.5 mg/dL (ref 0.3–1.2)
BUN: 10 mg/dL (ref 6–20)
CALCIUM: 8.9 mg/dL (ref 8.9–10.3)
CO2: 21 mmol/L — AB (ref 22–32)
Chloride: 103 mmol/L (ref 101–111)
Creatinine, Ser: 0.89 mg/dL (ref 0.44–1.00)
GFR calc Af Amer: >60 mL/min (ref >60)
GFR calc non Af Amer: >60 mL/min (ref >60)
Glucose, Bld: 125 mg/dL — ABNORMAL HIGH (ref 65–99)
Potassium: 3.4 mmol/L — ABNORMAL LOW (ref 3.5–5.1)
SODIUM: 135 mmol/L (ref 135–145)
Total Protein: 6.7 g/dL (ref 6.5–8.1)

## 2017-08-29 LAB — CBC
HEMATOCRIT: 36.1 % (ref 36.0–46.0)
HEMOGLOBIN: 11.4 g/dL — AB (ref 12.0–15.0)
MCH: 24 pg — AB (ref 26.0–34.0)
MCHC: 31.6 g/dL (ref 30.0–36.0)
MCV: 76 fL — ABNORMAL LOW (ref 78.0–100.0)
Platelets: 239 10*3/uL (ref 150–400)
RBC: 4.75 MIL/uL (ref 3.87–5.11)
RDW: 17.9 % — AB (ref 11.5–15.5)
WBC: 4.3 10*3/uL (ref 4.0–10.5)

## 2017-08-29 LAB — LIPASE, BLOOD: Lipase: 40 U/L (ref 11–51)

## 2017-08-29 NOTE — ED Triage Notes (Signed)
Pt states she was here Friday night and was diagnosed with the flu and dehydration  Pt states today her symptoms returned and her body hurts and she has vomiting and fever  Pt states she took zofran at home without relief

## 2017-08-29 NOTE — ED Triage Notes (Signed)
Patient complains of ongoing abdominal pain with vomiting and diarrhea. [facility] and received work-up with fluids. States that she doesn't feel any better. Alert and oriented, ongoing low grade fever

## 2017-08-29 NOTE — ED Notes (Signed)
Pt remains in waiting room. Updated on wait for treatment room. 

## 2017-08-29 NOTE — ED Notes (Signed)
[facility]

## 2017-08-30 ENCOUNTER — Emergency Department (HOSPITAL_COMMUNITY)
Admission: EM | Admit: 2017-08-30 | Discharge: 2017-08-30 | Disposition: A | Discharge status: Home / Self Care | Payer: Medicaid Other | Attending: Emergency Medicine | Admitting: Emergency Medicine

## 2017-08-30 DIAGNOSIS — D509 Iron deficiency anemia, unspecified: Secondary | ICD-10-CM

## 2017-08-30 DIAGNOSIS — R197 Diarrhea, unspecified: Secondary | ICD-10-CM

## 2017-08-30 DIAGNOSIS — R112 Nausea with vomiting, unspecified: Secondary | ICD-10-CM

## 2017-08-30 MED ORDER — KETOROLAC TROMETHAMINE 30 MG/ML IJ SOLN
30.0000 mg | Freq: Once | INTRAMUSCULAR | Status: AC
  Administered 2017-08-30: 30 mg via INTRAVENOUS
  Filled 2017-08-30: qty 1

## 2017-08-30 MED ORDER — DIPHENHYDRAMINE HCL 50 MG/ML IJ SOLN
25.0000 mg | Freq: Once | INTRAMUSCULAR | Status: AC
  Administered 2017-08-30: 25 mg via INTRAVENOUS
  Filled 2017-08-30: qty 1

## 2017-08-30 MED ORDER — METOCLOPRAMIDE HCL 10 MG PO TABS: 10.0000 mg | ORAL_TABLET | Freq: Four times a day (QID) | ORAL | 0 refills | Status: DC | PRN

## 2017-08-30 MED ORDER — SODIUM CHLORIDE 0.9 % IV BOLUS (SEPSIS)
1000.0000 mL | Freq: Once | INTRAVENOUS | Status: AC
  Administered 2017-08-30: 1000 mL via INTRAVENOUS

## 2017-08-30 MED ORDER — METOCLOPRAMIDE HCL 5 MG/ML IJ SOLN
10.0000 mg | Freq: Once | INTRAMUSCULAR | Status: AC
  Administered 2017-08-30: 10 mg via INTRAVENOUS
  Filled 2017-08-30: qty 2

## 2017-08-30 NOTE — ED Notes (Signed)
Soda given for PO challenge

## 2017-08-30 NOTE — ED Notes (Signed)
Patient on 2nd soda right now. Patient tolerated first soda.

## 2017-08-30 NOTE — Discharge Instructions (Signed)
Return if you are having any problems. 

## 2017-08-30 NOTE — ED Notes (Addendum)
Patient states she was diagnosed with the stomach flu Friday. Per patients family, she has been having symptoms for a week. Patient did not respond to other questions.

## 2017-08-30 NOTE — ED Provider Notes (Signed)
[facility]-EMERGENCY DEPT Provider Note   CSN: [id number] Arrival date & time: 08/29/17  2014     History   Chief Complaint Chief Complaint  Patient presents with  . Fever  . Emesis    HPI Destiny Wallace is a 37 y.o. female.  The history is provided by the patient.  She has been sick for the last 3 days with nausea, vomiting, diarrhea, fever.  She was seen in emergency 2 days ago and discharged with prescription for ondansetron and dicyclomine.  Unfortunately, she continues to have nausea and vomiting.  She did have one episode of diarrhea yesterday, but no longer feels like she is going to have more diarrhea.  She continues to have generalized body aches which she rates at 10/10.  She continues to run low-grade fevers although has not checked her temperature at home.  Past Medical History:  Diagnosis Date  . Hypertension during pregnancy     There are no active problems to display for this patient.   Past Surgical History:  Procedure Laterality Date  . CIRCLAGE    . TUBAL LIGATION      OB History    No data available       Home Medications    Prior to Admission medications   Medication Sig Start Date End Date Taking? Authorizing Provider  acetaminophen (TYLENOL) 500 MG tablet Take 1,000 mg by mouth every 6 (six) hours as needed for mild pain.   Yes [provider]  dicyclomine (BENTYL) 20 MG tablet Take 1 tablet (20 mg total) by mouth 2 (two) times daily as needed (abdominal cramping). 08/28/17  Yes Ward, Chase Picket, PA-C  ondansetron (ZOFRAN ODT) 4 MG disintegrating tablet Take 1 tablet (4 mg total) by mouth every 8 (eight) hours as needed for nausea or vomiting. 08/28/17  Yes Ward, Chase Picket, PA-C  cyclobenzaprine (FLEXERIL) 10 MG tablet Take 1 tablet (10 mg total) by mouth 2 (two) times daily as needed for muscle spasms. Patient not taking: Reported on 08/28/2017 10/31/16   Melene Plan, DO    Family History History reviewed.  No pertinent family history.  Social History Social History   Tobacco Use  . Smoking status: Former Games developer  . Smokeless tobacco: Former Neurosurgeon    Quit date: 07/19/2011  Substance Use Topics  . Alcohol use: Yes    Comment: 10  . Drug use: No     Allergies   Patient has no known allergies.   Review of Systems Review of Systems  All other systems reviewed and are negative.    Physical Exam Updated Vital Signs BP 115/70 (BP Location: Right Arm)   Pulse 69   Temp 100.3 F (37.9 C) (Oral)   Resp 18   LMP 08/08/2017 (Approximate)   SpO2 99%   Physical Exam  Nursing note and vitals reviewed.  37 year old female, resting comfortably and in no acute distress. Vital signs are normal. Oxygen saturation is 99%, which is normal. Head is normocephalic and atraumatic. PERRLA, EOMI. Oropharynx is clear. Neck is nontender and supple without adenopathy or JVD. Back is nontender and there is no CVA tenderness. Lungs are clear without rales, wheezes, or rhonchi. Chest is nontender. Heart has regular rate and rhythm without murmur. Abdomen is soft, flat, nontender without masses or hepatosplenomegaly and peristalsis is hypoactive. Extremities have no cyanosis or edema, full range of motion is present. Skin is warm and dry without rash. Neurologic: Mental status is normal, cranial nerves are  intact, there are no motor or sensory deficits.  ED Treatments / Results   Procedures Procedures   Medications Ordered in ED Medications  sodium chloride 0.9 % bolus 1,000 mL (not administered)  metoCLOPramide (REGLAN) injection 10 mg (not administered)  diphenhydrAMINE (BENADRYL) injection 25 mg (not administered)  ketorolac (TORADOL) 30 MG/ML injection 30 mg (not administered)     Initial Impression / Assessment and Plan / ED Course  I have reviewed the triage vital signs and the nursing notes.  Pertinent lab results that were available during my care of the patient were reviewed by me  and considered in my medical decision making (see chart for details).  Nausea, vomiting, diarrhea and body aches and pattern most consistent with viral gastroenteritis.  No red flags to suggest more serious illnesses.  Old records are reviewed confirming ED visit on February 16, where she was discharged with prescriptions for dicyclomine and ondansetron.  [facility] earlier tonight and did have lab work done which showed no change in chronic anemia, no change in BUN and creatinine.  She is noted to have hypokalemia which is somewhat improved from February 16.  She is given IV fluids and ketorolac.  Since she failed ondansetron, will try metoclopramide for nausea.  Since labs were done at 1914 on February 17, I do not feel she needs to have lab work repeated.  She feels significantly better after above-noted treatment.  She is tolerating oral fluids without any further vomiting.  She is discharged with prescription for metoclopramide.  Return precautions discussed.  Final Clinical Impressions(s) / ED Diagnoses   Final diagnoses:  Nausea vomiting and diarrhea  Microcytic anemia    ED Discharge Orders        Ordered    metoCLOPramide (REGLAN) 10 MG tablet  Every 6 hours PRN     08/30/17 0630       Dione BoozeGlick, Xin Klawitter, MD 08/30/17 [phone]

## 2018-01-13 ENCOUNTER — Encounter (HOSPITAL_COMMUNITY): Payer: Self-pay | Admitting: Emergency Medicine

## 2018-01-13 ENCOUNTER — Emergency Department (HOSPITAL_COMMUNITY)
Admission: EM | Admit: 2018-01-13 | Discharge: 2018-01-13 | Disposition: A | Discharge status: Home / Self Care | Payer: Medicaid Other | Attending: Emergency Medicine | Admitting: Emergency Medicine

## 2018-01-13 ENCOUNTER — Other Ambulatory Visit: Payer: Self-pay

## 2018-01-13 DIAGNOSIS — Z79899 Other long term (current) drug therapy: Secondary | ICD-10-CM | POA: Insufficient documentation

## 2018-01-13 DIAGNOSIS — K0889 Other specified disorders of teeth and supporting structures: Secondary | ICD-10-CM | POA: Insufficient documentation

## 2018-01-13 DIAGNOSIS — Z87891 Personal history of nicotine dependence: Secondary | ICD-10-CM | POA: Insufficient documentation

## 2018-01-13 MED ORDER — HYDROCODONE-ACETAMINOPHEN 5-325 MG PO TABS: 1.0000 | ORAL_TABLET | Freq: Four times a day (QID) | ORAL | 0 refills | Status: DC | PRN

## 2018-01-13 MED ORDER — HYDROCODONE-ACETAMINOPHEN 5-325 MG PO TABS
2.0000 | ORAL_TABLET | Freq: Once | ORAL | Status: AC
  Administered 2018-01-13: 2 via ORAL
  Filled 2018-01-13: qty 2

## 2018-01-13 MED ORDER — PENICILLIN V POTASSIUM 500 MG PO TABS: 500.0000 mg | ORAL_TABLET | Freq: Three times a day (TID) | ORAL | 0 refills | Status: DC

## 2018-01-13 NOTE — ED Provider Notes (Signed)
[facility]ENC[facility] Provider Note   CSN: [id number] Arrival date & time: 01/13/18  0402     History   Chief Complaint Chief Complaint  Patient presents with  . Dental Pain    HPI Destiny Wallace is a 37 y.o. female.  Patient is a 37 year old female with history of hypertension.  She presents today with complaints of dental pain.  This is been worsening over the past several days.  She has a decayed left lower molar that has been bothering her for some time, however has worsened.  She reports a change in her insurance that limits her dental access.  The history is provided by the patient.  Dental Pain   This is a new problem. The current episode started 2 days ago. The problem occurs constantly. The problem has been rapidly worsening. The pain is moderate.    Past Medical History:  Diagnosis Date  . Hypertension during pregnancy     There are no active problems to display for this patient.   Past Surgical History:  Procedure Laterality Date  . CIRCLAGE    . TUBAL LIGATION       OB History   None      Home Medications    Prior to Admission medications   Medication Sig Start Date End Date Taking? Authorizing Provider  acetaminophen (TYLENOL) 500 MG tablet Take 1,000 mg by mouth every 6 (six) hours as needed for mild pain.    [provider]  dicyclomine (BENTYL) 20 MG tablet Take 1 tablet (20 mg total) by mouth 2 (two) times daily as needed (abdominal cramping). 08/28/17   Ward, Chase PicketJaime Pilcher, PA-C  metoCLOPramide (REGLAN) 10 MG tablet Take 1 tablet (10 mg total) by mouth every 6 (six) hours as needed for nausea (or headache). 08/30/17   Dione BoozeGlick, David, MD  ondansetron (ZOFRAN ODT) 4 MG disintegrating tablet Take 1 tablet (4 mg total) by mouth every 8 (eight) hours as needed for nausea or vomiting. 08/28/17   Ward, Chase PicketJaime Pilcher, PA-C    Family History No family history on file.  Social History Social History   Tobacco Use    . Smoking status: Former Games developermoker  . Smokeless tobacco: Former NeurosurgeonUser    Quit date: 07/19/2011  Substance Use Topics  . Alcohol use: Yes    Comment: 10  . Drug use: No     Allergies   Patient has no known allergies.   Review of Systems Review of Systems  All other systems reviewed and are negative.    Physical Exam Updated Vital Signs BP 128/85 (BP Location: Right Arm)   Pulse 75   Temp 98.4 F (36.9 C) (Oral)   Resp (!) 22 Comment: tearful  Ht 5\' 7"  (1.702 m)   Wt 65.8 kg (145 lb)   SpO2 99%   BMI 22.71 kg/m   Physical Exam  Constitutional: She is oriented to person, place, and time. She appears well-developed and well-nourished. No distress.  HENT:  Head: Normocephalic and atraumatic.  Mouth/Throat: Oropharynx is clear and moist. No oropharyngeal exudate.  The left lower rear molar is decayed with surrounding gingival inflammation.  There is no obvious abscess.  There is no swelling or tenderness of the submental space.  There is no stridor.  Neck: Normal range of motion. Neck supple.  Pulmonary/Chest: Effort normal.  Neurological: She is alert and oriented to person, place, and time.  Skin: Skin is warm and dry. She is not diaphoretic.  Nursing  note and vitals reviewed.    ED Treatments / Results  Labs (all labs ordered are listed, but only abnormal results are displayed) Labs Reviewed - No data to display  EKG None  Radiology No results found.  Procedures Procedures (including critical care time)  Medications Ordered in ED Medications - No data to display   Initial Impression / Assessment and Plan / ED Course  I have reviewed the triage vital signs and the nursing notes.  Pertinent labs & imaging results that were available during my care of the patient were reviewed by me and considered in my medical decision making (see chart for details).  Patient will be treated with antibiotics and pain medication.  To follow-up with dentistry if not  improving.  [place] narcotic database searched prior to prescription.  Final Clinical Impressions(s) / ED Diagnoses   Final diagnoses:  None    ED Discharge Orders    None       Geoffery Lyons, MD 01/13/18 [phone]

## 2018-01-13 NOTE — ED Triage Notes (Signed)
Pt has been having left sided mouth pain for several months, this flair up woke her up out of her sleep.

## 2018-01-13 NOTE — Discharge Instructions (Addendum)
Penicillin as prescribed.  Hydrocodone is prescribed as needed for pain.  Follow-up with dentistry if not improving in the next 2 to 3 days.

## 2018-03-06 ENCOUNTER — Encounter (HOSPITAL_COMMUNITY): Payer: Self-pay | Admitting: *Deleted

## 2018-03-06 ENCOUNTER — Emergency Department (HOSPITAL_COMMUNITY)
Admission: EM | Admit: 2018-03-06 | Discharge: 2018-03-06 | Disposition: A | Discharge status: Home / Self Care | Payer: Medicaid Other | Attending: Emergency Medicine | Admitting: Emergency Medicine

## 2018-03-06 ENCOUNTER — Other Ambulatory Visit: Payer: Self-pay

## 2018-03-06 DIAGNOSIS — F17210 Nicotine dependence, cigarettes, uncomplicated: Secondary | ICD-10-CM | POA: Insufficient documentation

## 2018-03-06 DIAGNOSIS — M545 Low back pain, unspecified: Secondary | ICD-10-CM

## 2018-03-06 MED ORDER — CYCLOBENZAPRINE HCL 10 MG PO TABS: 10.0000 mg | ORAL_TABLET | Freq: Two times a day (BID) | ORAL | 0 refills | Status: DC | PRN

## 2018-03-06 MED ORDER — NAPROXEN 500 MG PO TABS: 500.0000 mg | ORAL_TABLET | Freq: Two times a day (BID) | ORAL | 0 refills | Status: DC

## 2018-03-06 NOTE — ED Triage Notes (Signed)
Pt has been having back pain for the past week. 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 C/o low back pain that shoots down her leg.

## 2018-03-06 NOTE — ED Notes (Signed)
Patient verbalizes understanding of discharge instructions. Opportunity for questioning and answers were provided. Armband removed by staff, pt discharged from ED ambulatory.   

## 2018-03-06 NOTE — ED Provider Notes (Signed)
[facility]ENC[facility] Provider Note   CSN: [id number] Arrival date & time: 03/06/18  2017     History   Chief Complaint Chief Complaint  Patient presents with  . Back Pain    HPI Destiny LashLynette M Wallace is a 37 y.o. female here for evaluation of back pain.  Back pain is described as sharp and tingling.  Back pain is localized to diffuse lower back worse on the left with radiation to the left buttock and left thigh.  Onset 1 week ago.  She denies any preceding trauma or exertional activity although she does do housekeeping for work.  Pain acutely worsened today while she was trying to stand up from sitting position.  She has taken ibuprofen without relief.  Aggravated by palpation, sitting up, bending forward, lifting.  No history of back injuries or surgeries.  She denies fevers, abdominal pain, dysuria, hematuria, changes in bowel movements, groin numbness, loss of bladder or bowel control, loss of sensation or heaviness to her extremities.  HPI  Past Medical History:  Diagnosis Date  . Hypertension during pregnancy     There are no active problems to display for this patient.   Past Surgical History:  Procedure Laterality Date  . CIRCLAGE    . TUBAL LIGATION       OB History   None      Home Medications    Prior to Admission medications   Medication Sig Start Date End Date Taking? Authorizing Provider  acetaminophen (TYLENOL) 500 MG tablet Take 1,000 mg by mouth every 6 (six) hours as needed for mild pain.    [provider]  cyclobenzaprine (FLEXERIL) 10 MG tablet Take 1 tablet (10 mg total) by mouth 2 (two) times daily as needed for muscle spasms. 03/06/18   Liberty HandyGibbons, Kerem Gilmer J, PA-C  dicyclomine (BENTYL) 20 MG tablet Take 1 tablet (20 mg total) by mouth 2 (two) times daily as needed (abdominal cramping). 08/28/17   Ward, Chase PicketJaime Pilcher, PA-C  HYDROcodone-acetaminophen (NORCO) 5-325 MG tablet Take 1-2 tablets by mouth every 6 (six) hours  as needed. 01/13/18   Geoffery Lyonselo, Douglas, MD  metoCLOPramide (REGLAN) 10 MG tablet Take 1 tablet (10 mg total) by mouth every 6 (six) hours as needed for nausea (or headache). 08/30/17   Dione BoozeGlick, David, MD  naproxen (NAPROSYN) 500 MG tablet Take 1 tablet (500 mg total) by mouth 2 (two) times daily. 03/06/18   Liberty HandyGibbons, Willette Mudry J, PA-C  ondansetron (ZOFRAN ODT) 4 MG disintegrating tablet Take 1 tablet (4 mg total) by mouth every 8 (eight) hours as needed for nausea or vomiting. 08/28/17   Ward, Chase PicketJaime Pilcher, PA-C  penicillin v potassium (VEETID) 500 MG tablet Take 1 tablet (500 mg total) by mouth 3 (three) times daily. 01/13/18   Geoffery Lyonselo, Douglas, MD    Family History No family history on file.  Social History Social History   Tobacco Use  . Smoking status: Current Some Day Smoker  . Smokeless tobacco: Former NeurosurgeonUser    Quit date: 07/19/2011  Substance Use Topics  . Alcohol use: Yes    Comment: 10  . Drug use: No     Allergies   Patient has no known allergies.   Review of Systems Review of Systems  All other systems reviewed and are negative.    Physical Exam Updated Vital Signs BP 133/87   Pulse 88   Temp 98.7 F (37.1 C)   Resp 16   LMP 02/02/2018   SpO2 100%  Physical Exam  Constitutional: She is oriented to person, place, and time. She appears well-developed and well-nourished.  Non-toxic appearance.  HENT:  Head: Normocephalic.  Right Ear: External ear normal.  Left Ear: External ear normal.  Nose: Nose normal.  Eyes: Conjunctivae and EOM are normal.  Neck: Full passive range of motion without pain.  Cardiovascular: Normal rate and regular rhythm.  2+ radial pulses bilaterally.  Pulmonary/Chest: Effort normal and breath sounds normal. No tachypnea.  Abdominal: Soft. There is no tenderness.  No suprapubic or CVA tenderness.  Negative Murphy's and McBurney's.  Musculoskeletal: Normal range of motion. She exhibits tenderness.  T-spine: No midline tenderness. L-spine: Diffuse,  mild tenderness throughout entire lumbar spine, worse over left SI joint and sciatic notch.  Positive SLR (left).  Negative Faber bilaterally.  Full PROM of hips without pain.  Neurological: She is alert and oriented to person, place, and time.  Sensation to light touch intact in lower extremities.  5/5 strength with hip, knee, ankle flexion and extension against resistance.  Skin: Skin is warm and dry. Capillary refill takes less than 2 seconds.  No rash to the TL spine  Psychiatric: Her behavior is normal. Thought content normal.     ED Treatments / Results  Labs (all labs ordered are listed, but only abnormal results are displayed) Labs Reviewed - No data to display  EKG None  Radiology No results found.  Procedures Procedures (including critical care time)  Medications Ordered in ED Medications - No data to display   Initial Impression / Assessment and Plan / ED Course  I have reviewed the triage vital signs and the nursing notes.  Pertinent labs & imaging results that were available during my care of the patient were reviewed by me and considered in my medical decision making (see chart for details).     Musculoskeletal exam most suggestive of MSK etiology possible nerve inflammation. Abdominal exam benign, without pulsatility, suprapubic or CVA tenderness. Distal pulses symmetric bilaterally. No focal neurological deficits. No overlaying rash. Considered UTI/pyelo, kidney stone, cauda equina, epidural abscess, dissection considered but these don't fit clinical picture.  No red flag features of back pain present such as saddle anesthesia, bladder/bowel incontinence or retention, fevers, h/o cancer, IVDU, preceding trauma or falls, unilateral weakness, urinary symptoms. Emergent lab/imaging not thought to be indicated today as physical exam reassuring.  Conservative measures such as ice/heat, mild stretches, muscle relaxer and high dose NSAIDs indicated with PCP follow-up if no  improvement with conservative management. ED return precautions discussed with patient who verbalized understanding and is agreeable to plan.    Final Clinical Impressions(s) / ED Diagnoses   Final diagnoses:  Acute bilateral low back pain without sciatica    ED Discharge Orders         Ordered    cyclobenzaprine (FLEXERIL) 10 MG tablet  2 times daily PRN     03/06/18 2117    naproxen (NAPROSYN) 500 MG tablet  2 times daily     03/06/18 2117           Jerrell Mylar 03/06/18 2129    Cathren Laine, MD 03/06/18 2300

## 2018-03-06 NOTE — Discharge Instructions (Signed)
You you were seen in the emergency department for back pain.  I suspect your pain is either from a muscular overuse injury, spasm or possibly nerve inflammation.  We will treat this with a combination of anti-inflammatories, muscle relaxers, stretches, rest. 1000 mg of acetaminophen (Tylenol) every 8 hours for the next 3 days 500 mg of naproxen every 12 hours for the next 3 days 10 mg cyclobenzaprine (Flexeril) every 12 hours for the next 3 days for associated spasms and tightness Rest for the next 48 hours to avoid further injury Transition back into daily activity slowly to avoid reinjury Over-the-counter lidocaine-containing patches can be applied to the area of pain for 12 hours at a time  Return to the ER if your pain worsens, you develop abdominal pain, urinary symptoms, changes to your bowel movements, numbness in your groin, loss of bladder or bowel control, loss of sensation or heaviness or weakness to your extremities, fevers

## 2018-09-22 ENCOUNTER — Emergency Department (HOSPITAL_COMMUNITY)
Admission: EM | Admit: 2018-09-22 | Discharge: 2018-09-22 | Disposition: A | Discharge status: Home / Self Care | Payer: Medicaid Other | Attending: Emergency Medicine | Admitting: Emergency Medicine

## 2018-09-22 ENCOUNTER — Encounter (HOSPITAL_COMMUNITY): Payer: Self-pay | Admitting: *Deleted

## 2018-09-22 ENCOUNTER — Encounter (HOSPITAL_COMMUNITY): Payer: Self-pay | Admitting: Emergency Medicine

## 2018-09-22 DIAGNOSIS — Y92410 Unspecified street and highway as the place of occurrence of the external cause: Secondary | ICD-10-CM | POA: Diagnosis not present

## 2018-09-22 DIAGNOSIS — S46812A Strain of other muscles, fascia and tendons at shoulder and upper arm level, left arm, initial encounter: Secondary | ICD-10-CM | POA: Insufficient documentation

## 2018-09-22 DIAGNOSIS — Y939 Activity, unspecified: Secondary | ICD-10-CM | POA: Diagnosis not present

## 2018-09-22 DIAGNOSIS — Y998 Other external cause status: Secondary | ICD-10-CM | POA: Diagnosis not present

## 2018-09-22 DIAGNOSIS — S4992XA Unspecified injury of left shoulder and upper arm, initial encounter: Secondary | ICD-10-CM | POA: Diagnosis present

## 2018-09-22 MED ORDER — CYCLOBENZAPRINE HCL 10 MG PO TABS: 10.0000 mg | ORAL_TABLET | Freq: Two times a day (BID) | ORAL | 0 refills | Status: DC | PRN
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
## 2019-07-11 ENCOUNTER — Encounter (HOSPITAL_COMMUNITY): Payer: Self-pay | Admitting: Emergency Medicine

## 2019-07-11 ENCOUNTER — Ambulatory Visit (HOSPITAL_COMMUNITY)
Admission: EM | Admit: 2019-07-11 | Discharge: 2019-07-11 | Disposition: A | Discharge status: Home / Self Care | Payer: Medicaid Other | Attending: Urgent Care | Admitting: Urgent Care

## 2019-07-11 ENCOUNTER — Other Ambulatory Visit: Payer: Self-pay

## 2019-07-11 DIAGNOSIS — N760 Acute vaginitis: Secondary | ICD-10-CM

## 2019-07-11 MED ORDER — FLUCONAZOLE 150 MG PO TABS: 150.0000 mg | ORAL_TABLET | ORAL | 0 refills | Status: DC

## 2019-07-11 NOTE — ED Provider Notes (Signed)
[facility]   MRN: [id number] DOB: 11-Nov-1980  Subjective:   Destiny Wallace is a 38 y.o. female presenting for 1 week history of recurrent vaginal itching with discharge.  Patient has a history of yeast infection and BV.  States that her symptoms are consistent with history of yeast infection.  However, she would like to make sure she gets checked for everything.  No current facility-administered medications for this encounter.  Current Outpatient Medications:  .  acetaminophen (TYLENOL) 500 MG tablet, Take 1,000 mg by mouth every 6 (six) hours as needed for mild pain., Disp: , Rfl:  .  cyclobenzaprine (FLEXERIL) 10 MG tablet, Take 1 tablet (10 mg total) by mouth 2 (two) times daily as needed for muscle spasms., Disp: 20 tablet, Rfl: 0 .  cyclobenzaprine (FLEXERIL) 10 MG tablet, Take 1 tablet (10 mg total) by mouth 2 (two) times daily as needed for muscle spasms., Disp: 20 tablet, Rfl: 0 .  dicyclomine (BENTYL) 20 MG tablet, Take 1 tablet (20 mg total) by mouth 2 (two) times daily as needed (abdominal cramping)., Disp: 20 tablet, Rfl: 0 .  HYDROcodone-acetaminophen (NORCO) 5-325 MG tablet, Take 1-2 tablets by mouth every 6 (six) hours as needed., Disp: 15 tablet, Rfl: 0 .  metoCLOPramide (REGLAN) 10 MG tablet, Take 1 tablet (10 mg total) by mouth every 6 (six) hours as needed for nausea (or headache)., Disp: 30 tablet, Rfl: 0 .  naproxen (NAPROSYN) 500 MG tablet, Take 1 tablet (500 mg total) by mouth 2 (two) times daily., Disp: 30 tablet, Rfl: 0 .  ondansetron (ZOFRAN ODT) 4 MG disintegrating tablet, Take 1 tablet (4 mg total) by mouth every 8 (eight) hours as needed for nausea or vomiting., Disp: 20 tablet, Rfl: 0 .  penicillin v potassium (VEETID) 500 MG tablet, Take 1 tablet (500 mg total) by mouth 3 (three) times daily., Disp: 30 tablet, Rfl: 0   No Known Allergies  Past Medical History:  Diagnosis Date  . Hypertension during pregnancy      Past Surgical History:   Procedure Laterality Date  . CIRCLAGE    . TUBAL LIGATION      No family history on file.  Social History   Tobacco Use  . Smoking status: Current Some Day Smoker  . Smokeless tobacco: Never Used  Substance Use Topics  . Alcohol use: Yes    Comment: 10  . Drug use: No    Review of Systems  Constitutional: Negative for chills and fever.  Respiratory: Negative for shortness of breath.   Cardiovascular: Negative for chest pain.  Gastrointestinal: Negative for abdominal pain, nausea and vomiting.  Genitourinary: Negative for dysuria, flank pain, frequency, hematuria and urgency.  Musculoskeletal: Negative for myalgias.  Skin: Negative for rash.  Neurological: Negative for dizziness and headaches.     Objective:   Vitals: BP 99/74   Pulse 72   Temp 98.2 F (36.8 C) (Oral)   Resp 16   LMP 06/14/2019   SpO2 100%   Physical Exam Constitutional:      General: She is not in acute distress.    Appearance: Normal appearance. She is well-developed. She is not ill-appearing, toxic-appearing or diaphoretic.  HENT:     Head: Normocephalic and atraumatic.     Nose: Nose normal.     Mouth/Throat:     Mouth: Mucous membranes are moist.     Pharynx: Oropharynx is clear.  Eyes:     General: No scleral icterus.    Extraocular Movements:  Extraocular movements intact.     Pupils: Pupils are equal, round, and reactive to light.  Cardiovascular:     Rate and Rhythm: Normal rate.  Pulmonary:     Effort: Pulmonary effort is normal.  Skin:    General: Skin is warm and dry.  Neurological:     General: No focal deficit present.     Mental Status: She is alert and oriented to person, place, and time.  Psychiatric:        Mood and Affect: Mood normal.        Behavior: Behavior normal.     Assessment and Plan :   1. Acute vaginitis     Will cover for yeast vaginitis with Diflucan, labs pending. Counseled patient on potential for adverse effects with medications  prescribed/recommended today, ER and return-to-clinic precautions discussed, patient verbalized understanding.    Jaynee Eagles, PA-C 07/11/19 1815

## 2019-07-11 NOTE — ED Triage Notes (Signed)
Vaginal discharge and itching for 1 week.

## 2019-07-13 ENCOUNTER — Telehealth (HOSPITAL_COMMUNITY): Payer: Self-pay | Admitting: Emergency Medicine

## 2019-07-13 LAB — CERVICOVAGINAL ANCILLARY ONLY
Bacterial vaginitis: POSITIVE — AB
Candida vaginitis: POSITIVE — AB
Chlamydia: NEGATIVE
Neisseria Gonorrhea: NEGATIVE
Trichomonas: NEGATIVE

## 2019-07-13 MED ORDER — METRONIDAZOLE 500 MG PO TABS: 500.0000 mg | ORAL_TABLET | Freq: Two times a day (BID) | ORAL | 0 refills | Status: AC

## 2019-07-13 NOTE — Telephone Encounter (Signed)
Bacterial vaginosis is positive. This was not treated at the urgent care visit.  Flagyl 500 mg BID x 7 days #14 no refills sent to patients pharmacy of choice.    Candida (yeast) is positive.  Prescription for fluconazole was given at the urgent care visit.    Patient contacted by phone and made aware of    results. Pt verbalized understanding and had all questions answered.    

## 2019-12-05 ENCOUNTER — Ambulatory Visit
Admission: EM | Admit: 2019-12-05 | Discharge: 2019-12-05 | Disposition: A | Discharge status: Home / Self Care | Payer: Medicaid Other | Attending: Physician Assistant | Admitting: Physician Assistant

## 2019-12-05 DIAGNOSIS — Z113 Encounter for screening for infections with a predominantly sexual mode of transmission: Secondary | ICD-10-CM | POA: Insufficient documentation

## 2019-12-05 NOTE — ED Provider Notes (Signed)
[facility]    CSN: [id number] Arrival date & time: 12/05/19  1032      History   Chief Complaint Chief Complaint  Patient presents with  . SEXUALLY TRANSMITTED DISEASE    HPI Destiny Wallace is a 39 y.o. female.   39 year old female comes in for STD testing. States noticed an odor during intercourse. Denies odor since. Denies vaginal discharge, itching, spotting. Denies urinary symptoms such as frequency, dysuria, hematuria. Denies fever, chills, body aches. Denies abdominal pain, nausea, vomiting. LMP 12/02/2019. Sexually active with 2 female partners. S/p tubal ligation.      Past Medical History:  Diagnosis Date  . Hypertension during pregnancy     There are no problems to display for this patient.   Past Surgical History:  Procedure Laterality Date  . CIRCLAGE    . TUBAL LIGATION      OB History   No obstetric history on file.      Home Medications    Prior to Admission medications   Medication Sig Start Date End Date Taking? Authorizing Provider  acetaminophen (TYLENOL) 500 MG tablet Take 1,000 mg by mouth every 6 (six) hours as needed for mild pain.    [provider]    Family History History reviewed. No pertinent family history.  Social History Social History   Tobacco Use  . Smoking status: Current Some Day Smoker  . Smokeless tobacco: Never Used  Substance Use Topics  . Alcohol use: Yes    Comment: 10  . Drug use: No     Allergies   Patient has no known allergies.   Review of Systems Review of Systems  Reason unable to perform ROS: See HPI as above.     Physical Exam Triage Vital Signs ED Triage Vitals  Enc Vitals Group     BP 12/05/19 1108 (!) 152/78     Pulse Rate 12/05/19 1108 83     Resp 12/05/19 1108 16     Temp 12/05/19 1108 98.3 F (36.8 C)     Temp Source 12/05/19 1108 Oral     SpO2 12/05/19 1108 97 %     Weight --      Height --      Head Circumference --      Peak Flow --      Pain  Score 12/05/19 1109 0     Pain Loc --      Pain Edu? --      Excl. in [place]? --    No data found.  Updated Vital Signs BP (!) 152/78 (BP Location: Left Arm)   Pulse 83   Temp 98.3 F (36.8 C) (Oral)   Resp 16   LMP 12/02/2019   SpO2 97%   Physical Exam Constitutional:      General: She is not in acute distress.    Appearance: Normal appearance. She is well-developed. She is not toxic-appearing or diaphoretic.  HENT:     Head: Normocephalic and atraumatic.  Eyes:     Conjunctiva/sclera: Conjunctivae normal.     Pupils: Pupils are equal, round, and reactive to light.  Pulmonary:     Effort: Pulmonary effort is normal. No respiratory distress.     Comments: Speaking in full sentences without difficulty Musculoskeletal:     Cervical back: Normal range of motion and neck supple.  Skin:    General: Skin is warm and dry.  Neurological:     Mental Status: She is alert and oriented  to person, place, and time.      UC Treatments / Results  Labs (all labs ordered are listed, but only abnormal results are displayed) Labs Reviewed  CERVICOVAGINAL ANCILLARY ONLY    EKG   Radiology No results found.  Procedures Procedures (including critical care time)  Medications Ordered in UC Medications - No data to display  Initial Impression / Assessment and Plan / UC Course  I have reviewed the triage vital signs and the nursing notes.  Pertinent labs & imaging results that were available during my care of the patient were reviewed by me and considered in my medical decision making (see chart for details).  Cytology sent, patient will be contacted with any positive results that require additional treatment. Patient to refrain from sexual activity for the next 7 days. Return precautions given.   Final Clinical Impressions(s) / UC Diagnoses   Final diagnoses:  Screen for STD (sexually transmitted disease)    ED Prescriptions    None     PDMP not reviewed this encounter.     Belinda Fisher, PA-C 12/05/19 1148
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
## 2019-12-05 NOTE — Discharge Instructions (Signed)
Cytology sent, you will be contacted with any positive results that requires further treatment. Refrain from sexual activity for the next 7 days. Monitor for any worsening of symptoms, fever, abdominal pain, nausea, vomiting, to follow up for reevaluation. Otherwise, follow up with GYN if symptoms not improving.

## 2019-12-06 LAB — CERVICOVAGINAL ANCILLARY ONLY
Bacterial Vaginitis (gardnerella): POSITIVE — AB
Candida Glabrata: NEGATIVE
Candida Vaginitis: NEGATIVE
Chlamydia: NEGATIVE
Comment: NEGATIVE
Comment: NEGATIVE
Comment: NEGATIVE
Comment: NEGATIVE
Comment: NEGATIVE
Comment: NORMAL
Neisseria Gonorrhea: NEGATIVE
Trichomonas: NEGATIVE

## 2019-12-07 ENCOUNTER — Telehealth (HOSPITAL_COMMUNITY): Payer: Self-pay | Admitting: Orthopedic Surgery

## 2019-12-07 MED ORDER — METRONIDAZOLE 500 MG PO TABS: 500.0000 mg | ORAL_TABLET | Freq: Two times a day (BID) | ORAL | 0 refills | Status: DC

## 2020-02-10 ENCOUNTER — Other Ambulatory Visit: Payer: Self-pay

## 2020-02-10 ENCOUNTER — Ambulatory Visit
Admission: RE | Admit: 2020-02-10 | Discharge: 2020-02-10 | Disposition: A | Discharge status: Home / Self Care | Payer: Medicaid Other | Source: Ambulatory Visit | Attending: Emergency Medicine | Admitting: Emergency Medicine

## 2020-02-10 VITALS — BP 112/70 | HR 64 | Temp 98.2°F | Resp 16

## 2020-02-10 DIAGNOSIS — Z113 Encounter for screening for infections with a predominantly sexual mode of transmission: Secondary | ICD-10-CM | POA: Diagnosis not present

## 2020-02-10 NOTE — ED Triage Notes (Signed)
Pt presents to [facility] for assessment after she states her sexual partners ejaculation has changed in smell and she is concerned.

## 2020-02-10 NOTE — Discharge Instructions (Signed)

## 2020-02-10 NOTE — ED Provider Notes (Signed)
[facility]    CSN: [id number] Arrival date & time: 02/10/20  4098      History   Chief Complaint Chief Complaint  Patient presents with  . Appointment  . STD Testing    HPI Destiny Wallace is a 39 y.o. female presenting for STI check.  States her sexual partner is ejaculatory fluid has change in smell.  Patient denies urinary symptoms, pelvic or abdominal pain, vaginal discharge or irritation.    Past Medical History:  Diagnosis Date  . Hypertension during pregnancy     There are no problems to display for this patient.   Past Surgical History:  Procedure Laterality Date  . CIRCLAGE    . TUBAL LIGATION      OB History   No obstetric history on file.      Home Medications    Prior to Admission medications   Medication Sig Start Date End Date Taking? Authorizing Provider  acetaminophen (TYLENOL) 500 MG tablet Take 1,000 mg by mouth every 6 (six) hours as needed for mild pain.    [provider]    Family History History reviewed. No pertinent family history.  Social History Social History   Tobacco Use  . Smoking status: Current Some Day Smoker  . Smokeless tobacco: Never Used  Substance Use Topics  . Alcohol use: Yes    Comment: 10  . Drug use: No     Allergies   Patient has no known allergies.   Review of Systems As per HPI   Physical Exam Triage Vital Signs ED Triage Vitals  Enc Vitals Group     BP 02/10/20 1011 112/70     Pulse Rate 02/10/20 1011 64     Resp 02/10/20 1011 16     Temp 02/10/20 1011 98.2 F (36.8 C)     Temp Source 02/10/20 1011 Oral     SpO2 02/10/20 1011 100 %     Weight --      Height --      Head Circumference --      Peak Flow --      Pain Score 02/10/20 1013 0     Pain Loc --      Pain Edu? --      Excl. in GC? --    No data found.  Updated Vital Signs BP 112/70 (BP Location: Left Arm)   Pulse 64   Temp 98.2 F (36.8 C) (Oral)   Resp 16   LMP 01/11/2020 (Approximate)  Comment: tubal ligation  SpO2 100%   Visual Acuity Right Eye Distance:   Left Eye Distance:   Bilateral Distance:    Right Eye Near:   Left Eye Near:    Bilateral Near:     Physical Exam Constitutional:      General: She is not in acute distress. HENT:     Head: Normocephalic and atraumatic.  Eyes:     General: No scleral icterus.    Pupils: Pupils are equal, round, and reactive to light.  Cardiovascular:     Rate and Rhythm: Normal rate.  Pulmonary:     Effort: Pulmonary effort is normal.  Abdominal:     General: Bowel sounds are normal.     Palpations: Abdomen is soft.     Tenderness: There is no abdominal tenderness. There is no right CVA tenderness, left CVA tenderness or guarding.  Genitourinary:    Comments: Patient declined, self-swab performed Skin:    Coloration: Skin is not jaundiced  or pale.  Neurological:     Mental Status: She is alert and oriented to person, place, and time.      UC Treatments / Results  Labs (all labs ordered are listed, but only abnormal results are displayed) Labs Reviewed - No data to display  EKG   Radiology No results found.  Procedures Procedures (including critical care time)  Medications Ordered in UC Medications - No data to display  Initial Impression / Assessment and Plan / UC Course  I have reviewed the triage vital signs and the nursing notes.  Pertinent labs & imaging results that were available during my care of the patient were reviewed by me and considered in my medical decision making (see chart for details).     Cytology pending: We will treat if indicated.  Return precautions discussed, pt verbalized understanding and is agreeable to plan. Final Clinical Impressions(s) / UC Diagnoses   Final diagnoses:  Screening examination for venereal disease     Discharge Instructions     Testing for chlamydia, gonorrhea, trichomonas is pending: please look for these results on the MyChart app/website.  We  will notify you if you are positive and outline treatment at that time.  Important to avoid all forms of sexual intercourse (oral, vaginal, anal) with any/all partners for the next 7 days to avoid spreading/reinfecting. Any/all sexual partners should be notified of testing/treatment today.  Return for persistent/worsening symptoms or if you develop fever, abdominal or pelvic pain, discharge, genital pain, blood in your urine, or are re-exposed to an STI.    ED Prescriptions    None     PDMP not reviewed this encounter.   Hall-Potvin, [place], [place] 02/10/20 1018

## 2020-02-10 NOTE — ED Notes (Signed)
Patient able to ambulate independently  

## 2020-02-13 LAB — CERVICOVAGINAL ANCILLARY ONLY
Bacterial Vaginitis (gardnerella): POSITIVE — AB
Chlamydia: NEGATIVE
Comment: NEGATIVE
Comment: NEGATIVE
Comment: NEGATIVE
Comment: NORMAL
Neisseria Gonorrhea: NEGATIVE
Trichomonas: NEGATIVE

## 2020-02-25 ENCOUNTER — Telehealth: Payer: Self-pay | Admitting: Emergency Medicine

## 2020-02-25 MED ORDER — METRONIDAZOLE 500 MG PO TABS: 500.0000 mg | ORAL_TABLET | Freq: Two times a day (BID) | ORAL | 0 refills | Status: DC

## 2020-02-25 NOTE — Telephone Encounter (Signed)
Patient called about cytology results: Noted on my chart that it was positive.  No medications were sent.  Flagyl sent to patient's confirm pharmacy.  No additional questions at this time.

## 2020-08-23 ENCOUNTER — Ambulatory Visit: Payer: Self-pay

## 2020-08-23 ENCOUNTER — Encounter: Payer: Self-pay | Admitting: Emergency Medicine

## 2020-08-23 ENCOUNTER — Other Ambulatory Visit: Payer: Self-pay

## 2020-08-23 ENCOUNTER — Ambulatory Visit
Admission: EM | Admit: 2020-08-23 | Discharge: 2020-08-23 | Disposition: A | Discharge status: Home / Self Care | Payer: Medicaid Other | Attending: Internal Medicine | Admitting: Internal Medicine

## 2020-08-23 DIAGNOSIS — N898 Other specified noninflammatory disorders of vagina: Secondary | ICD-10-CM | POA: Diagnosis present

## 2020-08-23 DIAGNOSIS — Z113 Encounter for screening for infections with a predominantly sexual mode of transmission: Secondary | ICD-10-CM | POA: Insufficient documentation

## 2020-08-23 MED ORDER — METRONIDAZOLE 500 MG PO TABS
2000.0000 mg | ORAL_TABLET | Freq: Once | ORAL | Status: AC
  Administered 2020-08-23: 2000 mg via ORAL

## 2020-08-23 MED ORDER — DOXYCYCLINE HYCLATE 100 MG PO CAPS: 100.0000 mg | ORAL_CAPSULE | Freq: Two times a day (BID) | ORAL | 0 refills | Status: DC

## 2020-08-23 MED ORDER — CEFTRIAXONE SODIUM 500 MG IJ SOLR
500.0000 mg | Freq: Once | INTRAMUSCULAR | Status: AC
  Administered 2020-08-23: 500 mg via INTRAMUSCULAR

## 2020-08-23 MED ORDER — FLUCONAZOLE 150 MG PO TABS
150.0000 mg | ORAL_TABLET | Freq: Every day | ORAL | 0 refills | Status: DC
Start: 2020-08-23 — End: 2020-12-16

## 2020-08-23 NOTE — Discharge Instructions (Signed)
Avoid sex til 3 days after finishing up the antibiotic Doxycycline.

## 2020-08-23 NOTE — ED Triage Notes (Signed)
Patient c/o abnormal discharge x 9 days.   Patient endorses a "thick , in between white and yellow" discharge.   Patient endorses some irritation.   Patient endorses some ABD cramps.   Patient denies any urinary symptoms.   Patient is concerned about STD's due to her partner.

## 2020-08-23 NOTE — ED Provider Notes (Signed)
[facility]    CSN: [id number] Arrival date & time: 08/23/20  1142      History   Chief Complaint Chief Complaint  Patient presents with  . Vaginal Discharge    HPI JASLEN ADCOX is a 40 y.o. female who presents with abnormal vaginal discharge x 9 days. The discharge is described and white and yellow mucossy.  Has been having lower abdominal cramps on RLQ. She denies UTI symptoms. Wants STD tests.  Her current female partner of 55 years's behavior is strange and talking about having trisom and wanting to party more and she is not in agreement to this .She denis having sex with anyone else but him LMP  Ended 2 days ago    Past Medical History:  Diagnosis Date  . Hypertension during pregnancy     There are no problems to display for this patient.   Past Surgical History:  Procedure Laterality Date  . CIRCLAGE    . TUBAL LIGATION      OB History   No obstetric history on file.      Home Medications    Prior to Admission medications   Medication Sig Start Date End Date Taking? Authorizing Provider  acetaminophen (TYLENOL) 500 MG tablet Take 1,000 mg by mouth every 6 (six) hours as needed for mild pain.    [provider]  metroNIDAZOLE (FLAGYL) 500 MG tablet Take 1 tablet (500 mg total) by mouth 2 (two) times daily. 02/25/20   Hall-Potvin, [place], PA-C    Family History History reviewed. No pertinent family history.  Social History Social History   Tobacco Use  . Smoking status: Current Some Day Smoker  . Smokeless tobacco: Never Used  Substance Use Topics  . Alcohol use: Yes    Comment: 10  . Drug use: No     Allergies   Patient has no known allergies.   Review of Systems Review of Systems  Constitutional: Positive for appetite change. Negative for chills, diaphoresis and fever.  Gastrointestinal: Positive for abdominal pain.  Genitourinary: Positive for pelvic pain and vaginal discharge. Negative for dysuria, frequency,  genital sores and urgency.  Skin: Negative for rash and wound.  Hematological: Negative for adenopathy.     Physical Exam Triage Vital Signs ED Triage Vitals  Enc Vitals Group     BP 08/23/20 1154 115/78     Pulse Rate 08/23/20 1154 61     Resp 08/23/20 1154 15     Temp 08/23/20 1154 98.2 F (36.8 C)     Temp Source 08/23/20 1154 Oral     SpO2 08/23/20 1154 98 %     Weight 08/23/20 1152 150 lb (68 kg)     Height 08/23/20 1152 5\' 7"  (1.702 m)     Head Circumference --      Peak Flow --      Pain Score 08/23/20 1151 6     Pain Loc --      Pain Edu? --      Excl. in GC? --    No data found.  Updated Vital Signs BP 115/78 (BP Location: Left Arm)   Pulse 61   Temp 98.2 F (36.8 C) (Oral)   Resp 15   Ht 5\' 7"  (1.702 m)   Wt 150 lb (68 kg)   LMP 08/17/2020   SpO2 98%   BMI 23.49 kg/m   Visual Acuity Right Eye Distance:   Left Eye Distance:   Bilateral Distance:    Right  Eye Near:   Left Eye Near:    Bilateral Near:     Physical Exam Vitals and nursing note reviewed.  Constitutional:      General: She is not in acute distress.    Appearance: She is normal weight. She is not toxic-appearing.  HENT:     Head: Normocephalic.  Eyes:     General: No scleral icterus.    Conjunctiva/sclera: Conjunctivae normal.  Pulmonary:     Effort: Pulmonary effort is normal.  Abdominal:     General: Abdomen is flat. Bowel sounds are normal. There is no distension.     Palpations: There is no mass.     Tenderness: There is no abdominal tenderness. There is no guarding or rebound.  Musculoskeletal:        General: Normal range of motion.     Cervical back: Neck supple.  Skin:    General: Skin is warm and dry.     Findings: No rash.  Neurological:     Mental Status: She is alert and oriented to person, place, and time.     Gait: Gait normal.  Psychiatric:        Mood and Affect: Mood normal.        Behavior: Behavior normal.        Thought Content: Thought content  normal.        Judgment: Judgment normal.    UC Treatments / Results  Labs (all labs ordered are listed, but only abnormal results are displayed) Labs Reviewed - No data to display  EKG   Radiology No results found.  Procedures Procedures (including critical care time)  Medications Ordered in UC Medications - No data to display  Initial Impression / Assessment and Plan / UC Course  I have reviewed the triage vital signs and the nursing notes. STD swabs are pending, she declined HIV testing. She wanted to proceed with treatment, so she was given  Rocephin 500 mg IM, and Flagyl 2G. I sent Doxy to cover Chlamydia and if negative she may stop this. I also sent Diflucan since she is prone to get yeast with antibiotic.  Final Clinical Impressions(s) / UC Diagnoses   Final diagnoses:  None   Discharge Instructions   None    ED Prescriptions    None     PDMP not reviewed this encounter.   Garey Ham, Cordelia Poche 08/23/20 1236

## 2020-08-26 ENCOUNTER — Telehealth (HOSPITAL_COMMUNITY): Payer: Self-pay | Admitting: Emergency Medicine

## 2020-08-26 LAB — CERVICOVAGINAL ANCILLARY ONLY
Bacterial Vaginitis (gardnerella): POSITIVE — AB
Candida Glabrata: NEGATIVE
Candida Vaginitis: POSITIVE — AB
Chlamydia: NEGATIVE
Comment: NEGATIVE
Comment: NEGATIVE
Comment: NEGATIVE
Comment: NEGATIVE
Comment: NEGATIVE
Comment: NORMAL
Neisseria Gonorrhea: NEGATIVE
Trichomonas: POSITIVE — AB

## 2020-11-23 ENCOUNTER — Emergency Department (HOSPITAL_COMMUNITY)
Admission: EM | Admit: 2020-11-23 | Discharge: 2020-11-23 | Disposition: A | Discharge status: Home / Self Care | Payer: Medicaid Other | Attending: Emergency Medicine | Admitting: Emergency Medicine

## 2020-11-23 ENCOUNTER — Other Ambulatory Visit: Payer: Self-pay

## 2020-11-23 DIAGNOSIS — F17200 Nicotine dependence, unspecified, uncomplicated: Secondary | ICD-10-CM | POA: Diagnosis not present

## 2020-11-23 DIAGNOSIS — U071 COVID-19: Secondary | ICD-10-CM

## 2020-11-23 DIAGNOSIS — R1084 Generalized abdominal pain: Secondary | ICD-10-CM | POA: Insufficient documentation

## 2020-11-23 DIAGNOSIS — R1110 Vomiting, unspecified: Secondary | ICD-10-CM | POA: Insufficient documentation

## 2020-11-23 DIAGNOSIS — R5081 Fever presenting with conditions classified elsewhere: Secondary | ICD-10-CM

## 2020-11-23 DIAGNOSIS — R197 Diarrhea, unspecified: Secondary | ICD-10-CM | POA: Diagnosis present

## 2020-11-23 LAB — RESP PANEL BY RT-PCR (FLU A&B, COVID) ARPGX2
Influenza A by PCR: NEGATIVE
Influenza B by PCR: NEGATIVE
SARS Coronavirus 2 by RT PCR: POSITIVE — AB

## 2020-11-23 LAB — CBC
HCT: 34.7 % — ABNORMAL LOW (ref 36.0–46.0)
Hemoglobin: 10.3 g/dL — ABNORMAL LOW (ref 12.0–15.0)
MCH: 20.9 pg — ABNORMAL LOW (ref 26.0–34.0)
MCHC: 29.7 g/dL — ABNORMAL LOW (ref 30.0–36.0)
MCV: 70.4 fL — ABNORMAL LOW (ref 80.0–100.0)
Platelets: 295 10*3/uL (ref 150–400)
RBC: 4.93 MIL/uL (ref 3.87–5.11)
RDW: 19.3 % — ABNORMAL HIGH (ref 11.5–15.5)
WBC: 9.1 10*3/uL (ref 4.0–10.5)
nRBC: 0 % (ref 0.0–0.2)

## 2020-11-23 LAB — LIPASE, BLOOD: Lipase: 29 U/L (ref 11–51)

## 2020-11-23 LAB — COMPREHENSIVE METABOLIC PANEL
ALT: 14 U/L (ref 0–44)
AST: 22 U/L (ref 15–41)
Albumin: 3.9 g/dL (ref 3.5–5.0)
Alkaline Phosphatase: 65 U/L (ref 38–126)
Anion gap: 6 (ref 5–15)
BUN: 7 mg/dL (ref 6–20)
CO2: 25 mmol/L (ref 22–32)
Calcium: 9.3 mg/dL (ref 8.9–10.3)
Chloride: 105 mmol/L (ref 98–111)
Creatinine, Ser: 0.78 mg/dL (ref 0.44–1.00)
GFR, Estimated: >60 mL/min (ref >60)
Glucose, Bld: 91 mg/dL (ref 70–99)
Potassium: 3.6 mmol/L (ref 3.5–5.1)
Sodium: 136 mmol/L (ref 135–145)
Total Bilirubin: 0.5 mg/dL (ref 0.3–1.2)
Total Protein: 7.3 g/dL (ref 6.5–8.1)

## 2020-11-23 LAB — URINALYSIS, ROUTINE W REFLEX MICROSCOPIC
Bilirubin Urine: NEGATIVE
Glucose, UA: NEGATIVE mg/dL
Hgb urine dipstick: NEGATIVE
Ketones, ur: 5 mg/dL — AB
Leukocytes,Ua: NEGATIVE
Nitrite: NEGATIVE
Protein, ur: 30 mg/dL — AB
Specific Gravity, Urine: 1.023 (ref 1.005–1.030)
pH: 6 (ref 5.0–8.0)

## 2020-11-23 LAB — I-STAT BETA HCG BLOOD, ED (MC, WL, AP ONLY): I-stat hCG, quantitative: <5 m[IU]/mL (ref <5)

## 2020-11-23 MED ORDER — ACETAMINOPHEN 325 MG PO TABS
650.0000 mg | ORAL_TABLET | Freq: Once | ORAL | Status: AC
  Administered 2020-11-23: 650 mg via ORAL
  Filled 2020-11-23: qty 2

## 2020-11-23 MED ORDER — ONDANSETRON 4 MG PO TBDP: 4.0000 mg | ORAL_TABLET | Freq: Three times a day (TID) | ORAL | 0 refills | Status: DC | PRN

## 2020-11-23 MED ORDER — ONDANSETRON 4 MG PO TBDP
4.0000 mg | ORAL_TABLET | Freq: Once | ORAL | Status: AC
  Administered 2020-11-23: 4 mg via ORAL
  Filled 2020-11-23: qty 1

## 2020-11-23 NOTE — ED Provider Notes (Signed)
[facility] EMERGENCY DEPARTMENT Provider Note   CSN: [id number] Arrival date & time: 11/23/20  1132     History Chief Complaint  Patient presents with  . Abdominal Pain  . Emesis  . Sore Throat  . Diarrhea    Destiny Wallace is a 40 y.o. female.  The history is provided by the patient. No language interpreter was used.  Abdominal Pain Pain location:  Generalized Pain quality: aching   Pain radiates to:  Does not radiate Pain severity:  Mild Onset quality:  Gradual Duration:  2 days Timing:  Constant Progression:  Worsening Chronicity:  New Relieved by:  Nothing Worsened by:  Nothing Ineffective treatments:  None tried Associated symptoms: diarrhea and vomiting   Emesis Associated symptoms: abdominal pain and diarrhea   Sore Throat This is a new problem. The problem occurs constantly. Associated symptoms include abdominal pain. Nothing aggravates the symptoms. Nothing relieves the symptoms. She has tried nothing for the symptoms.  Diarrhea Associated symptoms: abdominal pain and vomiting   Pt complains of sore throat for 2 days.  Pt reports nausea. Pt reports muscle cramps.  Pt reports she had a fever      Past Medical History:  Diagnosis Date  . Hypertension during pregnancy     There are no problems to display for this patient.   Past Surgical History:  Procedure Laterality Date  . CIRCLAGE    . TUBAL LIGATION       OB History   No obstetric history on file.     No family history on file.  Social History   Tobacco Use  . Smoking status: Current Some Day Smoker  . Smokeless tobacco: Never Used  Substance Use Topics  . Alcohol use: Yes    Comment: 10  . Drug use: No    Home Medications Prior to Admission medications   Medication Sig Start Date End Date Taking? Authorizing Provider  acetaminophen (TYLENOL) 500 MG tablet Take 1,000 mg by mouth every 6 (six) hours as needed for mild pain.    [provider]   doxycycline (VIBRAMYCIN) 100 MG capsule Take 1 capsule (100 mg total) by mouth 2 (two) times daily. 08/23/20   Rodriguez-Southworth, Nettie Elm, PA-C  fluconazole (DIFLUCAN) 150 MG tablet Take 1 tablet (150 mg total) by mouth daily. 08/23/20   Rodriguez-Southworth, Nettie Elm, PA-C  metroNIDAZOLE (FLAGYL) 500 MG tablet Take 1 tablet (500 mg total) by mouth 2 (two) times daily. 02/25/20   Hall-Potvin, [place], PA-C    Allergies    Patient has no known allergies.  Review of Systems   Review of Systems  Gastrointestinal: Positive for abdominal pain, diarrhea and vomiting.  All other systems reviewed and are negative.   Physical Exam Updated Vital Signs BP 116/80   Pulse 72   Temp 100.2 F (37.9 C) (Oral)   Resp 15   Ht 5\' 7"  (1.702 m)   Wt 68 kg   SpO2 100%   BMI 23.49 kg/m   Physical Exam Vitals and nursing note reviewed.  Constitutional:      Appearance: She is well-developed.  HENT:     Head: Normocephalic.  Eyes:     Extraocular Movements: Extraocular movements intact.  Cardiovascular:     Rate and Rhythm: Normal rate and regular rhythm.  Pulmonary:     Effort: Pulmonary effort is normal.  Abdominal:     General: Bowel sounds are normal. There is no distension.     Palpations: Abdomen is soft.  Musculoskeletal:        General: Normal range of motion.     Cervical back: Normal range of motion.  Skin:    General: Skin is warm.  Neurological:     General: No focal deficit present.     Mental Status: She is alert and oriented to person, place, and time.  Psychiatric:        Mood and Affect: Mood normal.     ED Results / Procedures / Treatments   Labs (all labs ordered are listed, but only abnormal results are displayed) Labs Reviewed  CBC - Abnormal; Notable for the following components:      Result Value   Hemoglobin 10.3 (*)    HCT 34.7 (*)    MCV 70.4 (*)    MCH 20.9 (*)    MCHC 29.7 (*)    RDW 19.3 (*)    All other components within normal limits  RESP  PANEL BY RT-PCR (FLU A&B, COVID) ARPGX2  LIPASE, BLOOD  COMPREHENSIVE METABOLIC PANEL  URINALYSIS, ROUTINE W REFLEX MICROSCOPIC  I-STAT BETA HCG BLOOD, ED ([facility], [facility], [facility] ONLY)    EKG None  Radiology No results found.  Procedures Procedures   Medications Ordered in ED Medications  acetaminophen (TYLENOL) tablet 650 mg (650 mg Oral Given 11/23/20 1313)    ED Course  I have reviewed the triage vital signs and the nursing notes.  Pertinent labs & imaging results that were available during my care of the patient were reviewed by me and considered in my medical decision making (see chart for details).  Clinical Course as of 11/23/20 1600  Sat Nov 23, 2020  1556 Resp Panel by RT-PCR (Flu A&B, Covid) Nasopharyngeal Swab [LS]    Clinical Course User Index [LS] Osie Cheeks   MDM Rules/Calculators/A&P                          MDM: Merleen Nicely and flu pending.  Ua pending.   Final Clinical Impression(s) / ED Diagnoses Final diagnoses:  Fever in other diseases  Viral illness    Rx / DC Orders ED Discharge Orders    None       Elson Areas, [place] 11/23/20 1612    Tegeler, Canary Brim, MD 11/23/20 [phone]

## 2020-11-23 NOTE — ED Triage Notes (Signed)
Pt reports sore throat last week, thought she was getting better yesterday, but then woke up this morning with abdominal pain and n/v/d.

## 2020-11-23 NOTE — ED Provider Notes (Signed)
Destiny Wallace is a 40 y.o. female, presenting to the ED with abdominal pain.  This pain had resolved upon the time of my interview with the patient.  HPI from Langston Masker, PA-C: "Destiny Wallace is a 40 y.o. female.  The history is provided by the patient. No language interpreter was used.  Abdominal Pain Pain location:  Generalized Pain quality: aching   Pain radiates to:  Does not radiate Pain severity:  Mild Onset quality:  Gradual Duration:  2 days Timing:  Constant Progression:  Worsening Chronicity:  New Relieved by:  Nothing Worsened by:  Nothing Ineffective treatments:  None tried Associated symptoms: diarrhea and vomiting   Emesis Associated symptoms: abdominal pain and diarrhea   Sore Throat This is a new problem. The problem occurs constantly. Associated symptoms include abdominal pain. Nothing aggravates the symptoms. Nothing relieves the symptoms. She has tried nothing for the symptoms.  Diarrhea Associated symptoms: abdominal pain and vomiting   Pt complains of sore throat for 2 days.  Pt reports nausea. Pt reports muscle cramps.  Pt reports she had a fever "  Physical Exam  BP 116/80   Pulse 72   Temp 100.2 F (37.9 C) (Oral)   Resp 15   Ht 5\' 7"  (1.702 m)   Wt 68 kg   SpO2 100%   BMI 23.49 kg/m   Physical Exam Vitals and nursing note reviewed.  Constitutional:      General: She is not in acute distress.    Appearance: She is well-developed. She is not diaphoretic.  HENT:     Head: Normocephalic and atraumatic.     Mouth/Throat:     Mouth: Mucous membranes are moist.     Pharynx: Oropharynx is clear.  Eyes:     Conjunctiva/sclera: Conjunctivae normal.  Cardiovascular:     Rate and Rhythm: Normal rate and regular rhythm.     Pulses: Normal pulses.          Radial pulses are 2+ on the right side and 2+ on the left side.       Posterior tibial pulses are 2+ on the right side and 2+ on the left side.     Heart sounds: Normal heart sounds.      Comments: Tactile temperature in the extremities appropriate and equal bilaterally. Pulmonary:     Effort: Pulmonary effort is normal. No respiratory distress.     Breath sounds: Normal breath sounds.  Abdominal:     Palpations: Abdomen is soft.     Tenderness: There is no abdominal tenderness. There is no guarding.  Musculoskeletal:     Cervical back: Neck supple.     Right lower leg: No edema.     Left lower leg: No edema.  Lymphadenopathy:     Cervical: No cervical adenopathy.  Skin:    General: Skin is warm and dry.  Neurological:     Mental Status: She is alert.  Psychiatric:        Mood and Affect: Mood and affect normal.        Speech: Speech normal.        Behavior: Behavior normal.     ED Course/Procedures     Procedures   Abnormal Labs Reviewed  RESP PANEL BY RT-PCR (FLU A&B, COVID) ARPGX2 - Abnormal; Notable for the following components:      Result Value   SARS Coronavirus 2 by RT PCR POSITIVE (*)    All other components within normal limits  CBC -  Abnormal; Notable for the following components:   Hemoglobin 10.3 (*)    HCT 34.7 (*)    MCV 70.4 (*)    MCH 20.9 (*)    MCHC 29.7 (*)    RDW 19.3 (*)    All other components within normal limits  URINALYSIS, ROUTINE W REFLEX MICROSCOPIC - Abnormal; Notable for the following components:   APPearance CLOUDY (*)    Ketones, ur 5 (*)    Protein, ur 30 (*)    Bacteria, UA RARE (*)    All other components within normal limits     MDM    Clinical Course as of 11/24/20 0139  Sat Nov 23, 2020  1556 Resp Panel by RT-PCR (Flu A&B, Covid) Nasopharyngeal Swab [LS]  1745 Resp(!): 30 Patient not tachypneic upon my reevaluation. [SJ]    Clinical Course User Index [LS] Elson Areas, PA-C [SJ] Anselm Pancoast, PA-C    Patient care handoff report received from [facility], PA-C. Plan: Patient awaiting results of COVID test and UA.  Patient presents with complaint of abdominal pain. Patient is nontoxic  appearing, afebrile, not tachycardic, not tachypneic, not hypotensive, maintains excellent SPO2 on room air, and is in no apparent distress.   I have reviewed the patient's chart to obtain more information.   I reviewed and interpreted the patient's labs. Patient positive for COVID.  Suspect this is what is driving her symptoms.  Benign serial abdominal exams. Tolerating oral fluids prior to discharge. The patient was given instructions for home care as well as return precautions. Patient voices understanding of these instructions, accepts the plan, and is comfortable with discharge.    Vitals:   11/23/20 1312 11/23/20 1315 11/23/20 1330 11/23/20 1500  BP: 129/84  133/79 116/80  Pulse: 75  66 72  Resp: 12  16 15   Temp: 100.2 F (37.9 C)     TempSrc: Oral     SpO2: 99%  99% 100%  Weight:  68 kg    Height:  5\' 7"  (1.702 m)         , PA-C 11/24/20 0139    Tegeler, Anselm Pancoast, MD 11/24/20 2034

## 2020-11-23 NOTE — ED Provider Notes (Incomplete)
  [facility] EMERGENCY DEPARTMENT Provider Note   CSN: [id number] Arrival date & time: 11/23/20  1132     History Chief Complaint  Patient presents with  . Abdominal Pain  . Emesis  . Sore Throat  . Diarrhea    MATTY Wallace is a 40 y.o. female.  HPI     Past Medical History:  Diagnosis Date  . Hypertension during pregnancy     There are no problems to display for this patient.   Past Surgical History:  Procedure Laterality Date  . CIRCLAGE    . TUBAL LIGATION       OB History   No obstetric history on file.     No family history on file.  Social History   Tobacco Use  . Smoking status: Current Some Day Smoker  . Smokeless tobacco: Never Used  Substance Use Topics  . Alcohol use: Yes    Comment: 10  . Drug use: No    Home Medications Prior to Admission medications   Medication Sig Start Date End Date Taking? Authorizing Provider  acetaminophen (TYLENOL) 500 MG tablet Take 1,000 mg by mouth every 6 (six) hours as needed for mild pain.    [provider]  doxycycline (VIBRAMYCIN) 100 MG capsule Take 1 capsule (100 mg total) by mouth 2 (two) times daily. 08/23/20   Rodriguez-Southworth, Nettie Elm, PA-C  fluconazole (DIFLUCAN) 150 MG tablet Take 1 tablet (150 mg total) by mouth daily. 08/23/20   Rodriguez-Southworth, Nettie Elm, PA-C  metroNIDAZOLE (FLAGYL) 500 MG tablet Take 1 tablet (500 mg total) by mouth 2 (two) times daily. 02/25/20   Hall-Potvin, [place], PA-C    Allergies    Patient has no known allergies.  Review of Systems   Review of Systems  Physical Exam Updated Vital Signs There were no vitals taken for this visit.  Physical Exam  ED Results / Procedures / Treatments   Labs (all labs ordered are listed, but only abnormal results are displayed) Labs Reviewed  LIPASE, BLOOD  COMPREHENSIVE METABOLIC PANEL  CBC  URINALYSIS, ROUTINE W REFLEX MICROSCOPIC  I-STAT BETA HCG BLOOD, ED ([facility], [facility], [facility] ONLY)     EKG None  Radiology No results found.  Procedures Procedures {Remember to document critical care time when appropriate:1}  Medications Ordered in ED Medications - No data to display  ED Course  I have reviewed the triage vital signs and the nursing notes.  Pertinent labs & imaging results that were available during my care of the patient were reviewed by me and considered in my medical decision making (see chart for details).    MDM Rules/Calculators/A&P                          *** Final Clinical Impression(s) / ED Diagnoses Final diagnoses:  None    Rx / DC Orders ED Discharge Orders    None

## 2020-11-23 NOTE — Discharge Instructions (Addendum)
COVID-19 Home Management:  You have had a positive test for COVID-19. COVID-19 is caused by a virus. Viruses do not require or respond to antibiotics. Treatment is symptomatic care and it is important to note that these symptoms may last for 7-14 days.   Hand washing: Wash your hands throughout the day, but especially before and after touching the face, using the restroom, sneezing, coughing, or touching surfaces that have been coughed or sneezed upon. Hydration: Symptoms of most illnesses will be intensified and complicated by dehydration. Dehydration can also extend the duration of symptoms. Drink plenty of fluids and get plenty of rest. You should be drinking at least half a liter of water an hour to stay hydrated. Electrolyte drinks (ex. Gatorade, Powerade, Pedialyte) are also encouraged. You should be drinking enough fluids to make your urine light yellow, almost clear. If this is not the case, you are not drinking enough water. Please note that some of the treatments indicated below will not be effective if you are not adequately hydrated. Diet: Please concentrate on hydration, however, you may introduce food slowly.  Start with a clear liquid diet, progressed to a full liquid diet, and then bland solids as you are able. Pain or fever: Ibuprofen, Naproxen, or acetaminophen (generic for Tylenol) for pain or fever.  Antiinflammatory medications: Take 600 mg of ibuprofen every 6 hours or 440 mg (over the counter dose) to 500 mg (prescription dose) of naproxen every 12 hours for the next 3 days. After this time, these medications may be used as needed for pain. Take these medications with food to avoid upset stomach. Choose only one of these medications, do not take them together. Acetaminophen (generic for Tylenol): Should you continue to have additional pain while taking the ibuprofen or naproxen, you may add in acetaminophen as needed. Your daily total maximum amount of acetaminophen from all sources  should be limited to 4000mg /day for persons without liver problems, or 2000mg /day for those with liver problems. Nausea/vomiting: Use the ondansetron (generic for Zofran) for nausea or vomiting.  This medication may not prevent all vomiting or nausea, but can help facilitate better hydration. Things that can help with nausea/vomiting also include peppermint/menthol candies, vitamin B12, and ginger. Diarrhea: May use medications such as loperamide (Imodium) or Bismuth subsalicylate (Pepto-Bismol). Cough: Teas, warm liquids, broths, and honey can help with cough. Zyrtec or Claritin: May add these medication daily to control underlying symptoms of congestion, sneezing, and other signs of allergies.  These medications are available over-the-counter. Generics: Cetirizine (generic for Zyrtec) and loratadine (generic for Claritin). Fluticasone: Use fluticasone (generic for Flonase), as directed, for nasal and sinus congestion.  This medication is available over-the-counter. Congestion: Plain guaifenesin (generic for plain Mucinex) may help relieve congestion. Saline sinus rinses and saline nasal sprays may also help relieve congestion.  Sore throat: Warm liquids or Chloraseptic spray may help soothe a sore throat. Gargle twice a day with a salt water solution made from a half teaspoon of salt in a cup of warm water.  Follow up: Follow up with a primary care provider within the next two weeks should symptoms fail to resolve. Return: Return to the ED for significantly worsening symptoms, shortness of breath, persistent/worsening chest pain, persistent vomiting, large amounts of blood in stool, worsening/localized abdominal pain, or any other major concerns.  For prescription assistance, may try using prescription discount sites or apps, such as goodrx.com  COVID-19 isolation recommendations  Patients who have symptoms consistent with COVID-19 should self isolate until: At  least 3 days (72 hours) have passed  since recovery, defined as resolution of fever without the use of fever reducing medications and improvement in respiratory symptoms (e.g., cough, shortness of breath), and At least 7 days have passed since symptoms first appeared. Retesting is not required and not recommended as patients can continue to test positive for several weeks despite lack of symptoms.

## 2020-11-25 ENCOUNTER — Encounter (HOSPITAL_COMMUNITY): Payer: Self-pay

## 2020-11-25 ENCOUNTER — Ambulatory Visit
Admission: EM | Admit: 2020-11-25 | Discharge: 2020-11-25 | Disposition: A | Discharge status: Home / Self Care | Payer: Medicaid Other

## 2020-11-25 ENCOUNTER — Emergency Department (HOSPITAL_COMMUNITY)
Admission: EM | Admit: 2020-11-25 | Discharge: 2020-11-25 | Disposition: A | Discharge status: Home / Self Care | Payer: Medicaid Other | Attending: Emergency Medicine | Admitting: Emergency Medicine

## 2020-11-25 ENCOUNTER — Encounter: Payer: Self-pay | Admitting: Emergency Medicine

## 2020-11-25 ENCOUNTER — Other Ambulatory Visit: Payer: Self-pay

## 2020-11-25 DIAGNOSIS — L509 Urticaria, unspecified: Secondary | ICD-10-CM

## 2020-11-25 DIAGNOSIS — U071 COVID-19: Secondary | ICD-10-CM | POA: Diagnosis present

## 2020-11-25 DIAGNOSIS — R21 Rash and other nonspecific skin eruption: Secondary | ICD-10-CM | POA: Diagnosis not present

## 2020-11-25 DIAGNOSIS — Z5321 Procedure and treatment not carried out due to patient leaving prior to being seen by health care provider: Secondary | ICD-10-CM | POA: Insufficient documentation

## 2020-11-25 MED ORDER — PREDNISONE 10 MG PO TABS: 20.0000 mg | ORAL_TABLET | Freq: Every day | ORAL | 0 refills | Status: AC

## 2020-11-25 NOTE — ED Triage Notes (Signed)
Patient is covid positive.  Patient woke at 3 am this morning with itching.  Patient has hives.  Patient denies breathing issues or swallowing issues

## 2020-11-25 NOTE — Discharge Instructions (Addendum)
Take medication as prescribed Continue with supportive treatment of COVID symptoms Return for follow up if you develop difficulty breathing or shortness of breath.

## 2020-11-25 NOTE — ED Triage Notes (Signed)
Patient reports she tested positive for COVID yesterday, started with full body rash today

## 2020-11-25 NOTE — ED Provider Notes (Signed)
[facility]    CSN: [id number] Arrival date & time: 11/25/20  0910      History   Chief Complaint Chief Complaint  Patient presents with  . covid positive    hive  . Urticaria    hives    HPI Destiny Wallace is a 40 y.o. female.   Pt is COVID positive.  She presents with hives that started earlier today.  She reports taking Zofran, but no other medications.  She denies new soaps, detergents, lotions.  She denies mouth/tongue/lip welling, trouble swallowing or breathing.  She has taken nothing for the sx.      Past Medical History:  Diagnosis Date  . Hypertension during pregnancy     There are no problems to display for this patient.   Past Surgical History:  Procedure Laterality Date  . CIRCLAGE    . TUBAL LIGATION      OB History   No obstetric history on file.      Home Medications    Prior to Admission medications   Medication Sig Start Date End Date Taking? Authorizing Provider  naproxen sodium (ALEVE) 220 MG tablet Take 220 mg by mouth.   Yes [provider]  ondansetron (ZOFRAN ODT) 4 MG disintegrating tablet Take 1 tablet (4 mg total) by mouth every 8 (eight) hours as needed for nausea or vomiting. 11/23/20  Yes Joy, Shawn C, PA-C  predniSONE (DELTASONE) 10 MG tablet Take 2 tablets (20 mg total) by mouth daily for 5 days. 11/25/20 11/30/20 Yes Trentan Trippe, Shanda Bumps, PA-C  acetaminophen (TYLENOL) 500 MG tablet Take 1,000 mg by mouth every 6 (six) hours as needed for mild pain.    [provider]  doxycycline (VIBRAMYCIN) 100 MG capsule Take 1 capsule (100 mg total) by mouth 2 (two) times daily. Patient not taking: Reported on 11/25/2020 08/23/20   Rodriguez-Southworth, Nettie Elm, PA-C  fluconazole (DIFLUCAN) 150 MG tablet Take 1 tablet (150 mg total) by mouth daily. Patient not taking: Reported on 11/25/2020 08/23/20   Rodriguez-Southworth, Nettie Elm, PA-C  metroNIDAZOLE (FLAGYL) 500 MG tablet Take 1 tablet (500 mg total) by mouth 2 (two)  times daily. Patient not taking: Reported on 11/25/2020 02/25/20   Hall-Potvin, [place], PA-C    Family History Family History  Problem Relation Age of Onset  . Asthma Mother   . Hypertension Mother   . Anxiety disorder Mother     Social History Social History   Tobacco Use  . Smoking status: Former Games developer  . Smokeless tobacco: Never Used  Vaping Use  . Vaping Use: Never used  Substance Use Topics  . Alcohol use: Yes    Comment: 10  . Drug use: No     Allergies   Patient has no known allergies.   Review of Systems Review of Systems  Constitutional: Negative for chills and fever.  HENT: Negative for ear pain and sore throat.   Eyes: Negative for pain and visual disturbance.  Respiratory: Negative for cough and shortness of breath.   Cardiovascular: Negative for chest pain and palpitations.  Gastrointestinal: Negative for abdominal pain and vomiting.  Genitourinary: Negative for dysuria and hematuria.  Musculoskeletal: Negative for arthralgias and back pain.  Skin: Positive for rash. Negative for color change.  Neurological: Negative for seizures and syncope.  All other systems reviewed and are negative.    Physical Exam Triage Vital Signs ED Triage Vitals  Enc Vitals Group     BP 11/25/20 1005 (!) 128/92     Pulse  Rate 11/25/20 1005 95     Resp --      Temp 11/25/20 1005 98.8 F (37.1 C)     Temp Source 11/25/20 1005 Oral     SpO2 11/25/20 1005 98 %     Weight --      Height --      Head Circumference --      Peak Flow --      Pain Score 11/25/20 1000 0     Pain Loc --      Pain Edu? --      Excl. in GC? --    No data found.  Updated Vital Signs BP (!) 128/92 (BP Location: Right Arm)   Pulse 95   Temp 98.8 F (37.1 C) (Oral)   LMP 11/03/2020   SpO2 98%   Visual Acuity Right Eye Distance:   Left Eye Distance:   Bilateral Distance:    Right Eye Near:   Left Eye Near:    Bilateral Near:     Physical Exam Vitals and nursing note  reviewed.  Constitutional:      General: She is not in acute distress.    Appearance: She is well-developed.  HENT:     Head: Normocephalic and atraumatic.     Mouth/Throat:     Pharynx: Oropharynx is clear. No pharyngeal swelling.  Eyes:     Conjunctiva/sclera: Conjunctivae normal.  Cardiovascular:     Rate and Rhythm: Normal rate and regular rhythm.     Heart sounds: No murmur heard.   Pulmonary:     Effort: Pulmonary effort is normal. No respiratory distress.     Breath sounds: Normal breath sounds.  Abdominal:     Palpations: Abdomen is soft.     Tenderness: There is no abdominal tenderness.  Musculoskeletal:     Cervical back: Neck supple.  Skin:    General: Skin is warm and dry.     Comments: Hives noted to bilateral lower extremities and upper extremities.   Neurological:     Mental Status: She is alert.      UC Treatments / Results  Labs (all labs ordered are listed, but only abnormal results are displayed) Labs Reviewed - No data to display  EKG   Radiology No results found.  Procedures Procedures (including critical care time)  Medications Ordered in UC Medications - No data to display  Initial Impression / Assessment and Plan / UC Course  I have reviewed the triage vital signs and the nursing notes.  Pertinent labs & imaging results that were available during my care of the patient were reviewed by me and considered in my medical decision making (see chart for details).     Hives noted to extremities.  Uncertain etiology, no new medications.  No mouth/tongue/lip swelling, no trouble breathing or swallowing.  She will take benadryl as needed.  Prednisone prescribed. Return precautions discussed.  Final Clinical Impressions(s) / UC Diagnoses   Final diagnoses:  Hives  COVID     Discharge Instructions     Take medication as prescribed Continue with supportive treatment of COVID symptoms Return for follow up if you develop difficulty  breathing or shortness of breath.    ED Prescriptions    Medication Sig Dispense Auth. Provider   predniSONE (DELTASONE) 10 MG tablet Take 2 tablets (20 mg total) by mouth daily for 5 days. 10 tablet Jodell Cipro, PA-C     PDMP not reviewed this encounter.   Jodell Cipro, PA-C 11/25/20 1059

## 2020-11-25 NOTE — ED Notes (Signed)
Pt left saying she wasn't staying due to having to wait

## 2020-11-27 ENCOUNTER — Encounter (HOSPITAL_COMMUNITY): Payer: Self-pay | Admitting: Emergency Medicine

## 2020-11-27 ENCOUNTER — Emergency Department (HOSPITAL_COMMUNITY)
Admission: EM | Admit: 2020-11-27 | Discharge: 2020-11-27 | Disposition: A | Discharge status: Home / Self Care | Payer: Medicaid Other | Attending: Emergency Medicine | Admitting: Emergency Medicine

## 2020-11-27 ENCOUNTER — Other Ambulatory Visit: Payer: Self-pay

## 2020-11-27 DIAGNOSIS — L509 Urticaria, unspecified: Secondary | ICD-10-CM

## 2020-11-27 DIAGNOSIS — Z87891 Personal history of nicotine dependence: Secondary | ICD-10-CM | POA: Insufficient documentation

## 2020-11-27 DIAGNOSIS — U071 COVID-19: Secondary | ICD-10-CM | POA: Diagnosis not present

## 2020-11-27 DIAGNOSIS — J029 Acute pharyngitis, unspecified: Secondary | ICD-10-CM | POA: Diagnosis present

## 2020-11-27 MED ORDER — EPINEPHRINE 0.3 MG/0.3ML IJ SOAJ
0.3000 mg | Freq: Once | INTRAMUSCULAR | Status: AC
  Administered 2020-11-27: 0.3 mg via INTRAMUSCULAR
  Filled 2020-11-27: qty 0.3

## 2020-11-27 MED ORDER — EPINEPHRINE 0.3 MG/0.3ML IJ SOAJ: 0.3000 mg | INTRAMUSCULAR | 0 refills | Status: DC | PRN

## 2020-11-27 MED ORDER — FAMOTIDINE 20 MG PO TABS
20.0000 mg | ORAL_TABLET | Freq: Once | ORAL | Status: AC
  Administered 2020-11-27: 20 mg via ORAL
  Filled 2020-11-27: qty 1

## 2020-11-27 MED ORDER — DIPHENHYDRAMINE HCL 25 MG PO CAPS
50.0000 mg | ORAL_CAPSULE | Freq: Once | ORAL | Status: AC
  Administered 2020-11-27: 50 mg via ORAL
  Filled 2020-11-27: qty 2

## 2020-11-27 NOTE — ED Notes (Signed)
PT complaining of difficulty swallowing

## 2020-11-27 NOTE — ED Triage Notes (Addendum)
Patient tested positive for Covid19 last Friday , reports persistent itchy skin rashes/hives all over her body onset Saturday prescribed with Prednisone and Benadryl at urgent care with no improvement . Respirations unlabored/no oral swelling or fever .

## 2020-11-27 NOTE — ED Provider Notes (Signed)
  Emergency Medicine Provider in Triage Note   MSE was initiated and I personally evaluated the patient  2:52 AM on Nov 27, 2020 as provider in triage.   Chief Complaint: Rash, COVID positive  HPI  Patient is a 40 y.o. who presents to the ED with complaints of rash x 5 days. Pruritic, generalized. No alleviating/aggravating factors. Taking prednisone & benadryl w/o relief. No new products, meds, or environments per patient. Tested positive for covid 19 day prior to onset.     Review of Systems  Positive: rasih Negative: Dyspnea, dysphagia, chest pain  Physical Exam  LMP 11/03/2020    Gen:   Awake, no distress   HEENT:  Atraumatic, no significant angioedema noted, airway patent.  Resp:  Normal effort  Cardiac:  Normal rate  Abd:   Nondistended, nontender  MSK:   Moves extremities without difficulty  Neuro:  Speech clear  Skin:   Urticarial rash present.   Medical Decision Making   Initiation of care has begun. The patient has been counseled on the process, plan, and necessity for staying for the completion/evaluation, informed that the remainder of the evaluation will be completed by another provider, this initial triage assessment does not replace that evaluation, and the importance of remaining in the ED until their evaluation is complete.  Chart reviewed for additional hx Seen @ UC 11/25/20 for rash- given prednisone prescription  Clinical Impression  Rash       Cherly Anderson, PA-C 11/27/20 0311    Virgina Norfolk, DO 11/27/20 [id number]

## 2020-11-27 NOTE — Discharge Instructions (Signed)
You can take Zyrtec or Allegra, available over the counter.  You may also take Benadryl according to label instructions.

## 2020-11-27 NOTE — ED Notes (Signed)
Patient reports hives spreading to her eye during stay in waiting room

## 2020-11-27 NOTE — ED Provider Notes (Signed)
[facility] EMERGENCY DEPARTMENT Provider Note   CSN: [id number] Arrival date & time: 11/27/20  0250     History Chief Complaint  Patient presents with  . Covid+/Urticaria    Destiny Wallace is a 40 y.o. female.  The history is provided by the patient and medical records.   Destiny Wallace is a 40 y.o. female who presents to the Emergency Department complaining of hives. She tested positive for COVID-19 on Friday and developed hives on Saturday she developed symptoms of COVID-19 infection last Wednesday with mild sore throat, diarrhea, vomiting, body aches as well as mild cough. Overall her symptoms have resolved from the COVID-19 infection. Her only current complaint is persistent urticaria with generalized itching. She went to urgent care two days ago and was started on steroids and Benadryl. She has not had any significant relief from these medications. She does not take any additional over-the-counter medications. She has no known medical problems. No new detergents, soaps, creams or lotions. She denies any chance of pregnancy.      Past Medical History:  Diagnosis Date  . Hypertension during pregnancy     There are no problems to display for this patient.   Past Surgical History:  Procedure Laterality Date  . CIRCLAGE    . TUBAL LIGATION       OB History   No obstetric history on file.     Family History  Problem Relation Age of Onset  . Asthma Mother   . Hypertension Mother   . Anxiety disorder Mother     Social History   Tobacco Use  . Smoking status: Former Games developer  . Smokeless tobacco: Never Used  Vaping Use  . Vaping Use: Never used  Substance Use Topics  . Alcohol use: Yes    Comment: 10  . Drug use: No    Home Medications Prior to Admission medications   Medication Sig Start Date End Date Taking? Authorizing Provider  acetaminophen (TYLENOL) 500 MG tablet Take 1,000 mg by mouth every 6 (six) hours as needed for mild pain.     [provider]  doxycycline (VIBRAMYCIN) 100 MG capsule Take 1 capsule (100 mg total) by mouth 2 (two) times daily. Patient not taking: Reported on 11/25/2020 08/23/20   Rodriguez-Southworth, Nettie Elm, PA-C  fluconazole (DIFLUCAN) 150 MG tablet Take 1 tablet (150 mg total) by mouth daily. Patient not taking: Reported on 11/25/2020 08/23/20   Rodriguez-Southworth, Nettie Elm, PA-C  metroNIDAZOLE (FLAGYL) 500 MG tablet Take 1 tablet (500 mg total) by mouth 2 (two) times daily. Patient not taking: Reported on 11/25/2020 02/25/20   Hall-Potvin, [place], PA-C  naproxen sodium (ALEVE) 220 MG tablet Take 220 mg by mouth.    [provider]  ondansetron (ZOFRAN ODT) 4 MG disintegrating tablet Take 1 tablet (4 mg total) by mouth every 8 (eight) hours as needed for nausea or vomiting. 11/23/20   Joy, Shawn C, PA-C  predniSONE (DELTASONE) 10 MG tablet Take 2 tablets (20 mg total) by mouth daily for 5 days. 11/25/20 11/30/20  Jodell Cipro, PA-C    Allergies    Patient has no known allergies.  Review of Systems   Review of Systems  All other systems reviewed and are negative.   Physical Exam Updated Vital Signs BP 135/85   Pulse 83   Temp 97.9 F (36.6 C) (Oral)   Resp 18   LMP 11/03/2020   SpO2 100%   Physical Exam Vitals and nursing note reviewed.  Constitutional:  Appearance: She is well-developed.  HENT:     Head: Normocephalic and atraumatic.     Nose: No congestion.     Mouth/Throat:     Mouth: Mucous membranes are moist.     Comments: No edema in the posterior oropharynx Cardiovascular:     Rate and Rhythm: Normal rate and regular rhythm.     Heart sounds: No murmur heard.   Pulmonary:     Effort: Pulmonary effort is normal. No respiratory distress.     Breath sounds: Normal breath sounds.  Abdominal:     Palpations: Abdomen is soft.     Tenderness: There is no abdominal tenderness. There is no guarding or rebound.  Musculoskeletal:        General: No  tenderness.  Skin:    General: Skin is warm and dry.     Comments: Urticarial rash to arms, trunk, neck, legs.  Neurological:     Mental Status: She is alert and oriented to person, place, and time.  Psychiatric:        Behavior: Behavior normal.     ED Results / Procedures / Treatments   Labs (all labs ordered are listed, but only abnormal results are displayed) Labs Reviewed - No data to display  EKG None  Radiology No results found.  Procedures Procedures   Medications Ordered in ED Medications  famotidine (PEPCID) tablet 20 mg (20 mg Oral Given 11/27/20 0313)    ED Course  I have reviewed the triage vital signs and the nursing notes.  Pertinent labs & imaging results that were available during my care of the patient were reviewed by me and considered in my medical decision making (see chart for details).    MDM Rules/Calculators/A&P                         patient here for evaluation of itchy rash, examination is consistent with urticaria. This is likely viral in etiology given her recent diagnosis of COVID-19. No clinical evidence of anaphylaxis. Given patient's persistent itching and severe symptoms will provide trial of epinephrine to evaluate for temporary relief of her hives.  On repeat evaluation following epinephrine hives have resolved and she feels significantly improved.  No current evidence of anaphylaxis.  D/w pt symptoms concerning for anaphylaxis. Discussed outpatient follow up and return precautions.    Final Clinical Impression(s) / ED Diagnoses Final diagnoses:  Urticaria    Rx / DC Orders ED Discharge Orders    None       Tilden Fossa, MD 11/27/20 [phone]

## 2020-11-27 NOTE — ED Notes (Signed)
Patient verbalizes understanding of discharge instructions. Opportunity for questioning and answers were provided. Armband removed by staff, pt discharged from ED via wheelchair to lobby to away for ride.

## 2020-12-16 ENCOUNTER — Ambulatory Visit
Admission: EM | Admit: 2020-12-16 | Discharge: 2020-12-16 | Disposition: A | Discharge status: Home / Self Care | Payer: Medicaid Other | Attending: Emergency Medicine | Admitting: Emergency Medicine

## 2020-12-16 ENCOUNTER — Other Ambulatory Visit: Payer: Self-pay

## 2020-12-16 DIAGNOSIS — N898 Other specified noninflammatory disorders of vagina: Secondary | ICD-10-CM | POA: Diagnosis present

## 2020-12-16 MED ORDER — METRONIDAZOLE 500 MG PO TABS: 500.0000 mg | ORAL_TABLET | Freq: Two times a day (BID) | ORAL | 0 refills | Status: AC

## 2020-12-16 NOTE — Discharge Instructions (Addendum)
Begin metronidazole twice daily for 1 week, no alcohol while taking  We are testing you for Gonorrhea, Chlamydia, Trichomonas, Yeast and Bacterial Vaginosis. We will call you if anything is positive and let you know if you require any further treatment. Please inform partners of any positive results.   Please return if symptoms not improving with treatment, development of fever, nausea, vomiting, abdominal pain.

## 2020-12-16 NOTE — ED Triage Notes (Signed)
Pt requesting STD testing. Two week h/o vaginal discharge and two days of vaginal irritation. Denies vaginal odor and itching. No hematuria and dysuria. No urinary frequency or urgency.

## 2020-12-17 NOTE — ED Provider Notes (Signed)
[facility]    CSN: [id number] Arrival date & time: 12/16/20  1912      History   Chief Complaint Chief Complaint  Patient presents with  . Vaginal Discharge    HPI Destiny Wallace is a 40 y.o. female presenting today for evaluation of vaginal discharge.  Reports over the past 2 weeks has had vaginal discharge, has developed more vaginal irritation over the past few days.  Resting STDs testing.  Denies any odor or significant itching.  Denies any urinary symptoms.  Last menstrual cycle approximately 2 to 3 weeks ago.  Denies abdominal pain nausea or vomiting.  HPI  Past Medical History:  Diagnosis Date  . Hypertension during pregnancy     There are no problems to display for this patient.   Past Surgical History:  Procedure Laterality Date  . CIRCLAGE    . TUBAL LIGATION      OB History   No obstetric history on file.      Home Medications    Prior to Admission medications   Medication Sig Start Date End Date Taking? Authorizing Provider  metroNIDAZOLE (FLAGYL) 500 MG tablet Take 1 tablet (500 mg total) by mouth 2 (two) times daily for 7 days. 12/16/20 12/23/20 Yes Nayzeth Altman C, PA-C  acetaminophen (TYLENOL) 500 MG tablet Take 1,000 mg by mouth every 6 (six) hours as needed for mild pain.    [provider]  EPINEPHrine 0.3 mg/0.3 mL IJ SOAJ injection Inject 0.3 mg into the muscle as needed for anaphylaxis. 11/27/20   Tilden Fossa, MD  naproxen sodium (ALEVE) 220 MG tablet Take 220 mg by mouth.    [provider]    Family History Family History  Problem Relation Age of Onset  . Asthma Mother   . Hypertension Mother   . Anxiety disorder Mother     Social History Social History   Tobacco Use  . Smoking status: Former Games developer  . Smokeless tobacco: Never Used  Vaping Use  . Vaping Use: Never used  Substance Use Topics  . Alcohol use: Yes    Comment: 10  . Drug use: No     Allergies   Patient has no known  allergies.   Review of Systems Review of Systems  Constitutional: Negative for fever.  Respiratory: Negative for shortness of breath.   Cardiovascular: Negative for chest pain.  Gastrointestinal: Negative for abdominal pain, diarrhea, nausea and vomiting.  Genitourinary: Positive for vaginal discharge. Negative for dysuria, flank pain, genital sores, hematuria, menstrual problem, vaginal bleeding and vaginal pain.  Musculoskeletal: Negative for back pain.  Skin: Negative for rash.  Neurological: Negative for dizziness, light-headedness and headaches.     Physical Exam Triage Vital Signs ED Triage Vitals  Enc Vitals Group     BP 12/16/20 2001 (!) 145/71     Pulse Rate 12/16/20 2001 74     Resp 12/16/20 2001 18     Temp 12/16/20 2001 98.4 F (36.9 C)     Temp Source 12/16/20 2001 Oral     SpO2 12/16/20 2001 99 %     Weight --      Height --      Head Circumference --      Peak Flow --      Pain Score 12/16/20 2005 0     Pain Loc --      Pain Edu? --      Excl. in GC? --    No data found.  Updated  Vital Signs BP (!) 145/71 (BP Location: Right Arm)   Pulse 74   Temp 98.4 F (36.9 C) (Oral)   Resp 18   LMP  (LMP Unknown)   SpO2 99%   Visual Acuity Right Eye Distance:   Left Eye Distance:   Bilateral Distance:    Right Eye Near:   Left Eye Near:    Bilateral Near:     Physical Exam Vitals and nursing note reviewed.  Constitutional:      Appearance: She is well-developed.     Comments: No acute distress  HENT:     Head: Normocephalic and atraumatic.     Nose: Nose normal.  Eyes:     Conjunctiva/sclera: Conjunctivae normal.  Cardiovascular:     Rate and Rhythm: Normal rate.  Pulmonary:     Effort: Pulmonary effort is normal. No respiratory distress.  Abdominal:     General: There is no distension.  Musculoskeletal:        General: Normal range of motion.     Cervical back: Neck supple.  Skin:    General: Skin is warm and dry.  Neurological:      Mental Status: She is alert and oriented to person, place, and time.      UC Treatments / Results  Labs (all labs ordered are listed, but only abnormal results are displayed) Labs Reviewed  CERVICOVAGINAL ANCILLARY ONLY    EKG   Radiology No results found.  Procedures Procedures (including critical care time)  Medications Ordered in UC Medications - No data to display  Initial Impression / Assessment and Plan / UC Course  I have reviewed the triage vital signs and the nursing notes.  Pertinent labs & imaging results that were available during my care of the patient were reviewed by me and considered in my medical decision making (see chart for details).     Vaginal discharge/vaginitis-empirically treating for BV today with metronidazole, vaginal swab pending.  Will call with results and provide further treatment as needed based off results.  Discussed strict return precautions. Patient verbalized understanding and is agreeable with plan.  Final Clinical Impressions(s) / UC Diagnoses   Final diagnoses:  Vaginal discharge     Discharge Instructions     Begin metronidazole twice daily for 1 week, no alcohol while taking  We are testing you for Gonorrhea, Chlamydia, Trichomonas, Yeast and Bacterial Vaginosis. We will call you if anything is positive and let you know if you require any further treatment. Please inform partners of any positive results.   Please return if symptoms not improving with treatment, development of fever, nausea, vomiting, abdominal pain.     ED Prescriptions    Medication Sig Dispense Auth. Provider   metroNIDAZOLE (FLAGYL) 500 MG tablet Take 1 tablet (500 mg total) by mouth 2 (two) times daily for 7 days. 14 tablet Ronne Stefanski, [place] C, PA-C     PDMP not reviewed this encounter.   Lew Dawes, [place] 12/17/20 [phone]

## 2020-12-18 ENCOUNTER — Telehealth (HOSPITAL_COMMUNITY): Payer: Self-pay | Admitting: Emergency Medicine

## 2020-12-18 LAB — CERVICOVAGINAL ANCILLARY ONLY
Bacterial Vaginitis (gardnerella): POSITIVE — AB
Candida Glabrata: NEGATIVE
Candida Vaginitis: NEGATIVE
Chlamydia: NEGATIVE
Comment: NEGATIVE
Comment: NEGATIVE
Comment: NEGATIVE
Comment: NEGATIVE
Comment: NEGATIVE
Comment: NORMAL
Neisseria Gonorrhea: NEGATIVE
Trichomonas: POSITIVE — AB

## 2020-12-18 NOTE — Telephone Encounter (Signed)
Opened in error

## 2021-01-28 ENCOUNTER — Encounter: Payer: Self-pay | Admitting: Emergency Medicine

## 2021-01-28 ENCOUNTER — Ambulatory Visit
Admission: EM | Admit: 2021-01-28 | Discharge: 2021-01-28 | Disposition: A | Discharge status: Home / Self Care | Payer: Medicaid Other | Attending: Emergency Medicine | Admitting: Emergency Medicine

## 2021-01-28 ENCOUNTER — Other Ambulatory Visit: Payer: Self-pay

## 2021-01-28 DIAGNOSIS — N898 Other specified noninflammatory disorders of vagina: Secondary | ICD-10-CM | POA: Diagnosis present

## 2021-01-28 DIAGNOSIS — Z113 Encounter for screening for infections with a predominantly sexual mode of transmission: Secondary | ICD-10-CM

## 2021-01-28 LAB — POCT URINALYSIS DIP (MANUAL ENTRY)
Bilirubin, UA: NEGATIVE
Glucose, UA: NEGATIVE mg/dL
Ketones, POC UA: NEGATIVE mg/dL
Nitrite, UA: NEGATIVE
Protein Ur, POC: NEGATIVE mg/dL
Spec Grav, UA: 1.015 (ref 1.010–1.025)
Urobilinogen, UA: 0.2 E.U./dL
pH, UA: 6.5 (ref 5.0–8.0)

## 2021-01-28 MED ORDER — METRONIDAZOLE 500 MG PO TABS: 500.0000 mg | ORAL_TABLET | Freq: Two times a day (BID) | ORAL | 0 refills | Status: AC

## 2021-01-28 NOTE — Discharge Instructions (Addendum)
Take the medication as written. Give [place] a working phone number so that we can contact you if needed. Refrain from sexual contact until you know your results and your partner(s) are treated if necessary.   Below is a list of primary care practices who are taking new patients for you to follow-up with.  [facility] internal medicine clinic Ground Floor - [facility], [address] [place], [place], [place] [address] [phone]  [facility] at [facility] [address] [place], [place] [address] [phone]  [facility] [address] [place], [place] [address] [phone]  [facility] [phone] [address] [place] [address]  [facility]: [address] [place] [address]  [phone]  [facility]: [address] [place] [address]  [phone]  [place] [facility] : [address] [place] [address] [phone]  [facility]: [address] [place] [address] [phone]  [facility]: [address] [place] [address] [phone]  Dr. Oneal Grout [address] [facility] [place] [address]  [phone]  Go to www.goodrx.com  or www.costplusdrugs.com to look up your medications. This will give you a list of where you can find your prescriptions at the most affordable prices. Or ask the pharmacist what the cash price is, or if they have any other discount programs available to help make your medication more affordable. This can be less expensive than what you would pay with insurance.

## 2021-01-28 NOTE — ED Triage Notes (Signed)
Pt had unprotected sex with the same partner that she got her last STD from and started having vaginal discharge today. Denies pain or itching

## 2021-01-28 NOTE — ED Provider Notes (Signed)
HPI  SUBJECTIVE:  Destiny Wallace is a 40 y.o. female who presents with [place], white, odorous vaginal discharge starting today.  States that she had unprotected sex with her ex boyfriend 4 days ago, who gave her trichomonas.  Questionable vaginal itching.  No irritation, rash, abnormal bleeding, abdominal, back, pelvic pain, urinary complaints.  She denies having any other partners, but is not sure if he does.  She does not know if he has any symptoms.  No antibiotics in the past month.  No aggravating or alleviating factors.  She has not tried anything for symptoms.  She has a past medical history of chlamydia, trichomonas, frequent BV and yeast.  No history of gonorrhea, HSV, HIV, syphilis, diabetes.  LMP: Status post bilateral tubal ligation.  Denies possibility being pregnant.  PMD: None  Past Medical History:  Diagnosis Date   Hypertension during pregnancy     Past Surgical History:  Procedure Laterality Date   CIRCLAGE     TUBAL LIGATION      Family History  Problem Relation Age of Onset   Asthma Mother    Hypertension Mother    Anxiety disorder Mother     Social History   Tobacco Use   Smoking status: Former   Smokeless tobacco: Never  Building services engineer Use: Never used  Substance Use Topics   Alcohol use: Yes    Comment: 10   Drug use: No    No current facility-administered medications for this encounter.  Current Outpatient Medications:    metroNIDAZOLE (FLAGYL) 500 MG tablet, Take 1 tablet (500 mg total) by mouth 2 (two) times daily for 7 days., Disp: 14 tablet, Rfl: 0   acetaminophen (TYLENOL) 500 MG tablet, Take 1,000 mg by mouth every 6 (six) hours as needed for mild pain., Disp: , Rfl:    EPINEPHrine 0.3 mg/0.3 mL IJ SOAJ injection, Inject 0.3 mg into the muscle as needed for anaphylaxis., Disp: 1 each, Rfl: 0   naproxen sodium (ALEVE) 220 MG tablet, Take 220 mg by mouth., Disp: , Rfl:   No Known Allergies   ROS  As noted in HPI.   Physical Exam  BP  (!) 150/98   Pulse 70   Temp 98.9 F (37.2 C) (Oral)   Resp 20   SpO2 98%   Constitutional: Well developed, well nourished, no acute distress Eyes:  EOMI, conjunctiva normal bilaterally HENT: Normocephalic, atraumatic,mucus membranes moist Respiratory: Normal inspiratory effort Cardiovascular: Normal rate GI: nondistended soft, nontender. No suprapubic tenderness  back: No CVA tenderness GU: deferred skin: No rash, skin intact Musculoskeletal: no deformities Neurologic: Alert & oriented x 3, no focal neuro deficits Psychiatric: Speech and behavior appropriate   ED Course   Medications - No data to display  Orders Placed This Encounter  Procedures   POCT urinalysis dipstick    Standing Status:   Standing    Number of Occurrences:   1    No results found for this or any previous visit (from the past 24 hour(s)).  No results found.  ED Clinical Impression  1. Vaginal discharge   2. Screening for STDs (sexually transmitted diseases)      ED Assessment/Plan  UA noted.  She has no urinary complaints.  Will not treat as a UTI.  Suspect it is from the vaginal discharge.  Presentation most consistent with BV.  Home with Flagyl.  Gonorrhea, chlamydia, trichomonas, BV, yeast sent.  Patient declined HIV, RPR.  Vaginal swab results pending at the  time of signing of this note.  Advised pt to refrain from sexual contact until she knows lab results, symptoms resolve, and partner(s) are treated if necessary. Pt provided working phone number. Follow-up with PMD of choice as needed.  Will provide primary care list and order assistance in finding a PMD.  Discussed labs, MDM, plan and followup with patient. Pt agrees with plan.   Meds ordered this encounter  Medications   metroNIDAZOLE (FLAGYL) 500 MG tablet    Sig: Take 1 tablet (500 mg total) by mouth 2 (two) times daily for 7 days.    Dispense:  14 tablet    Refill:  0    *This clinic note was created using [employer]. Therefore, there may be occasional mistakes despite careful proofreading.  ?     Domenick Gong, MD 01/30/21 1129

## 2021-01-30 LAB — CERVICOVAGINAL ANCILLARY ONLY
Bacterial Vaginitis (gardnerella): POSITIVE — AB
Candida Glabrata: NEGATIVE
Candida Vaginitis: NEGATIVE
Chlamydia: NEGATIVE
Comment: NEGATIVE
Comment: NEGATIVE
Comment: NEGATIVE
Comment: NEGATIVE
Comment: NEGATIVE
Comment: NORMAL
Neisseria Gonorrhea: NEGATIVE
Trichomonas: NEGATIVE

## 2021-02-07 ENCOUNTER — Encounter (HOSPITAL_COMMUNITY): Payer: Self-pay

## 2021-03-19 ENCOUNTER — Encounter: Payer: Self-pay | Admitting: Emergency Medicine

## 2021-03-19 ENCOUNTER — Ambulatory Visit
Admission: EM | Admit: 2021-03-19 | Discharge: 2021-03-19 | Disposition: A | Discharge status: Home / Self Care | Payer: Medicaid Other | Attending: Urgent Care | Admitting: Urgent Care

## 2021-03-19 ENCOUNTER — Other Ambulatory Visit: Payer: Self-pay

## 2021-03-19 DIAGNOSIS — Z7251 High risk heterosexual behavior: Secondary | ICD-10-CM | POA: Diagnosis present

## 2021-03-19 DIAGNOSIS — Z8619 Personal history of other infectious and parasitic diseases: Secondary | ICD-10-CM | POA: Diagnosis present

## 2021-03-19 DIAGNOSIS — N760 Acute vaginitis: Secondary | ICD-10-CM | POA: Diagnosis present

## 2021-03-19 DIAGNOSIS — N898 Other specified noninflammatory disorders of vagina: Secondary | ICD-10-CM

## 2021-03-19 DIAGNOSIS — B9689 Other specified bacterial agents as the cause of diseases classified elsewhere: Secondary | ICD-10-CM | POA: Insufficient documentation

## 2021-03-19 MED ORDER — METRONIDAZOLE 500 MG PO TABS: 500.0000 mg | ORAL_TABLET | Freq: Two times a day (BID) | ORAL | 0 refills | Status: DC

## 2021-03-19 NOTE — ED Triage Notes (Signed)
Pt here for STD testing with some vaginal discharge x 1week

## 2021-03-19 NOTE — ED Provider Notes (Signed)
[facility]   MRN: [id number] DOB: 03/08/1981  Subjective:   Destiny Wallace is a 40 y.o. female presenting for 1 week history of malodorous vaginal discharge. Patient is sexually active, did not use condoms last time, has 1 female partner. Patient had tubal ligation.  Has a history of bacterial vaginosis, trichomonas.  She is trying to establish care with a gynecologist.  Denies fever, nausea, vomiting, abdominal or pelvic pain, dysuria, urinary frequency, genital rash.  No current facility-administered medications for this encounter.  Current Outpatient Medications:    acetaminophen (TYLENOL) 500 MG tablet, Take 1,000 mg by mouth every 6 (six) hours as needed for mild pain., Disp: , Rfl:    EPINEPHrine 0.3 mg/0.3 mL IJ SOAJ injection, Inject 0.3 mg into the muscle as needed for anaphylaxis., Disp: 1 each, Rfl: 0   naproxen sodium (ALEVE) 220 MG tablet, Take 220 mg by mouth., Disp: , Rfl:    No Known Allergies  Past Medical History:  Diagnosis Date   Hypertension during pregnancy      Past Surgical History:  Procedure Laterality Date   CIRCLAGE     TUBAL LIGATION      Family History  Problem Relation Age of Onset   Asthma Mother    Hypertension Mother    Anxiety disorder Mother     Social History   Tobacco Use   Smoking status: Former   Smokeless tobacco: Never  Building services engineer Use: Never used  Substance Use Topics   Alcohol use: Yes    Comment: 10   Drug use: No    ROS   Objective:   Vitals: BP 112/68 (BP Location: Left Arm)   Pulse 62   Temp 98.1 F (36.7 C) (Oral)   SpO2 98%   Physical Exam Constitutional:      General: She is not in acute distress.    Appearance: Normal appearance. She is well-developed. She is not ill-appearing, toxic-appearing or diaphoretic.  HENT:     Head: Normocephalic and atraumatic.     Nose: Nose normal.     Mouth/Throat:     Mouth: Mucous membranes are moist.     Pharynx: Oropharynx is clear.   Eyes:     General: No scleral icterus.       Right eye: No discharge.        Left eye: No discharge.     Extraocular Movements: Extraocular movements intact.     Conjunctiva/sclera: Conjunctivae normal.     Pupils: Pupils are equal, round, and reactive to light.  Cardiovascular:     Rate and Rhythm: Normal rate.  Pulmonary:     Effort: Pulmonary effort is normal.  Abdominal:     General: Bowel sounds are normal. There is no distension.     Palpations: Abdomen is soft. There is no mass.     Tenderness: There is no abdominal tenderness. There is no right CVA tenderness, left CVA tenderness, guarding or rebound.  Skin:    General: Skin is warm and dry.  Neurological:     General: No focal deficit present.     Mental Status: She is alert and oriented to person, place, and time.  Psychiatric:        Mood and Affect: Mood normal.        Behavior: Behavior normal.        Thought Content: Thought content normal.        Judgment: Judgment normal.    Assessment and Plan :  PDMP not reviewed this encounter.  1. Bacterial vaginosis   2. Vaginal discharge   3. History of trichomoniasis   4. Unprotected sex     Will cover empirically for recurrent bacterial vaginosis with metronidazole.  This can also cover for trichomoniasis.  Labs pending, will treat as appropriate otherwise.  Continue with plan to establish care with a gynecologist for regular checkups and follow-up on her current symptoms, recurrences of BV. Counseled patient on potential for adverse effects with medications prescribed/recommended today, ER and return-to-clinic precautions discussed, patient verbalized understanding.    Wallis Bamberg, [place] 03/19/21 1559

## 2021-03-20 LAB — CERVICOVAGINAL ANCILLARY ONLY
Bacterial Vaginitis (gardnerella): POSITIVE — AB
Candida Glabrata: NEGATIVE
Candida Vaginitis: POSITIVE — AB
Chlamydia: NEGATIVE
Comment: NEGATIVE
Comment: NEGATIVE
Comment: NEGATIVE
Comment: NEGATIVE
Comment: NEGATIVE
Comment: NORMAL
Neisseria Gonorrhea: NEGATIVE
Trichomonas: NEGATIVE

## 2021-03-21 ENCOUNTER — Telehealth (HOSPITAL_COMMUNITY): Payer: Self-pay

## 2021-03-21 MED ORDER — FLUCONAZOLE 150 MG PO TABS: 150.0000 mg | ORAL_TABLET | Freq: Every day | ORAL | 0 refills | Status: AC

## 2021-04-01 ENCOUNTER — Encounter: Payer: Self-pay | Admitting: Family Medicine

## 2021-04-01 ENCOUNTER — Other Ambulatory Visit: Payer: Self-pay

## 2021-04-01 ENCOUNTER — Ambulatory Visit (INDEPENDENT_AMBULATORY_CARE_PROVIDER_SITE_OTHER): Payer: Medicaid Other | Admitting: Family Medicine

## 2021-04-01 ENCOUNTER — Other Ambulatory Visit (HOSPITAL_COMMUNITY)
Admission: RE | Admit: 2021-04-01 | Discharge: 2021-04-01 | Disposition: A | Discharge status: Home / Self Care | Payer: Medicaid Other | Source: Ambulatory Visit | Attending: Family Medicine | Admitting: Family Medicine

## 2021-04-01 VITALS — BP 121/78 | HR 63 | Ht 67.0 in | Wt 169.2 lb

## 2021-04-01 DIAGNOSIS — Z1231 Encounter for screening mammogram for malignant neoplasm of breast: Secondary | ICD-10-CM | POA: Diagnosis not present

## 2021-04-01 DIAGNOSIS — N760 Acute vaginitis: Secondary | ICD-10-CM

## 2021-04-01 DIAGNOSIS — Z124 Encounter for screening for malignant neoplasm of cervix: Secondary | ICD-10-CM | POA: Diagnosis not present

## 2021-04-01 DIAGNOSIS — Z01419 Encounter for gynecological examination (general) (routine) without abnormal findings: Secondary | ICD-10-CM

## 2021-04-01 DIAGNOSIS — B9689 Other specified bacterial agents as the cause of diseases classified elsewhere: Secondary | ICD-10-CM

## 2021-04-01 MED ORDER — METRONIDAZOLE 500 MG PO TABS: 500.0000 mg | ORAL_TABLET | Freq: Two times a day (BID) | ORAL | 0 refills | Status: AC

## 2021-04-01 MED ORDER — FLUCONAZOLE 150 MG PO TABS: 150.0000 mg | ORAL_TABLET | Freq: Once | ORAL | 0 refills | Status: AC

## 2021-04-01 MED ORDER — METRONIDAZOLE 0.75 % VA GEL: 1.0000 | VAGINAL | 5 refills | Status: DC

## 2021-04-01 NOTE — Progress Notes (Signed)
GYNECOLOGY OFFICE VISIT NOTE  History:   Destiny Wallace is a 40 y.o. No obstetric history on file. here today for recurrent BV.  Patient reports numerous BV infections over the past few years Feels like it is every other month Denies douching or using other products in the vagina No recent sexual intercourse  Reports no paps for about ten years Does not recall any abnormal ones  Has not had a mammogram  Health Maintenance Due  Topic Date Due   COVID-19 Vaccine (1) Never done   HIV Screening  Never done   Hepatitis C Screening  Never done   TETANUS/TDAP  Never done   PAP SMEAR-Modifier  Never done   INFLUENZA VACCINE  Never done    Past Medical History:  Diagnosis Date   Hypertension during pregnancy     Past Surgical History:  Procedure Laterality Date   CIRCLAGE     TUBAL LIGATION      The following portions of the patient's history were reviewed and updated as appropriate: allergies, current medications, past family history, past medical history, past social history, past surgical history and problem list.   Health Maintenance:   Last pap: No results found for: DIAGPAP, HPV, HPVHIGH needs  Last mammogram:  needs   Review of Systems:  Pertinent items noted in HPI and remainder of comprehensive ROS otherwise negative.  Physical Exam:  BP 121/78   Pulse 63   Ht 5\' 7"  (1.702 m)   Wt 169 lb 3.2 oz (76.7 kg)   LMP 03/18/2021 (Approximate)   BMI 26.50 kg/m  CONSTITUTIONAL: Well-developed, well-nourished female in no acute distress.  HEENT:  Normocephalic, atraumatic. External right and left ear normal. No scleral icterus.  NECK: Normal range of motion, supple, no masses noted on observation SKIN: No rash noted. Not diaphoretic. No erythema. No pallor. MUSCULOSKELETAL: Normal range of motion. No edema noted. NEUROLOGIC: Alert and oriented to person, place, and time. Normal muscle tone coordination.  PSYCHIATRIC: Normal mood and affect. Normal behavior.  Normal judgment and thought content. RESPIRATORY: Effort normal, no problems with respiration noted PELVIC: Normal appearing external genitalia; normal appearing vaginal mucosa and cervix with small amount of white discharge.  Labs and Imaging No results found for this or any previous visit (from the past 168 hour(s)). No results found.    Assessment and Plan:   Problem List Items Addressed This Visit       Genitourinary   Recurrent BV    Numerous documented BV infections over the past several years. No douching or other poor hygenic habits. Denies any current symptoms of BV. Recommended twice weekly suppressive therapy with metrogel, can also do course of PO Flagyl if swab today returns positive. Trial boric acid if this fails.   Due for pap, collected today.  Due for mammogram, ordered and scheduled today.   Accepts STI screening by swab, defers serologies to future visit.       Relevant Medications   metroNIDAZOLE (FLAGYL) 500 MG tablet   fluconazole (DIFLUCAN) 150 MG tablet   Other Visit Diagnoses     Encounter for gynecological examination    -  Primary   Relevant Orders   Cytology - PAP( [place])   Cervicovaginal ancillary only( [place])   MM 3D SCREEN BREAST BILATERAL   Screening for cervical cancer       Encounter for screening mammogram for malignant neoplasm of breast           Routine preventative health  maintenance measures emphasized. Please refer to After Visit Summary for other counseling recommendations.   Return if symptoms worsen or fail to improve.    Total face-to-face time with patient: 15 minutes.  Over 50% of encounter was spent on counseling and coordination of care.   Venora Maples, MD/MPH Attending Family Medicine Physician, [facility] for [facility], [facility]

## 2021-04-01 NOTE — Patient Instructions (Signed)
AREA FAMILY PRACTICE PHYSICIANS  [place] ([address]) [facility] [address] [place], [place] [address] [phone] Mon-Fri 8:30-12:30, 1:30-5:00 Accepting Medicaid [facility] [address], [place], [place] [address] [phone] Mon-Fri 8:00-5:30 [facility] [address] [place], [place] [address] [phone] Mon, Tue, Thur, Fri 8:30-5:00, Wed 10:00-7:00 (closed 1-2pm) Accepting Medicaid [facility] [address], [place], [place]  [address] Phone - [phone]   Fax - [phone]  [place] ([address]) [facility] [address] [place], [place] [address] [phone] Mon-Fri 8:00-5:00 [facility] ([facility])  [address] [place], [place] [address] [phone] Mon-Fri 8:30-5:30, Sat (Oct.-Mar.) 9:00-1:00 Accepting Medicaid  [place] ([address]) [facility] [address], [place], [place] [address] [phone] Mon-Fri 8:00-5:00  [place] ([address]) [facility] [address], [place], [place] [address] [phone] Mon-Fri 8:00-5:00 [facility] [address], [place], [place] [address] [phone] Mon-Fri 8:00-5:00 [facility] [address] [place], [place] [address] [phone] Mon-Fri 8:00-5:00 [facility] [address] [place] [address] [phone] Mon-Fri 7:30-5:30  [place] ([address]) [facility] [address] [place], [place] [address] [phone] Mon-Thur 8:00-6:00 Accepting Medicaid [facility] [address] [place], [place] [address] [phone] Mon-Thur 7:30-7:30, Fri 7:30-4:30 Accepting  Medicaid [facility] [address], [place], [place]  [address] [phone]   Fax - [phone]  [place] ([address] & [address]) [facility] [address] [place], [place] [address] [phone] Mon-Fri 7:00-5:00 [facility] [address], [place], [place] [address] [phone] Mon-Fri 8:00-5:00 Accepting Medicaid [facility] [address], , [place] [address] [phone] Mon-Fri 8:00-5:00 Accepting Medicaid  [place] ([address]) [facility] [place] [address] [place], [place] [address] [phone] Mon-Fri 8:00-5:00 [facility] [address], [place], [place] [address] [phone] Mon-Fri 8:00-5:00 Accepting Medicaid [facility] ([facility] [address], [place], [place] [address] [phone] Mon-Fri 8:00-5:30, Sat&Sun by appointment (phones open at 8:30) Accepting Medicaid  [place] ([address]) [facility] [address] [place], [place] [address] [phone] Mon-Thur 8:00-7:00, Fri 8:00-5:00, Sat 8:00-12:00, Sun 9:00-12:00 Accepting Medicaid [facility] [address], [place], [place] [address] [phone] Mon-Thur 8:00-5:00 Accepting Medicaid [facility] [address] [place], [place] [address] [phone] Mon-Fri 8:00-5:30, Sat (Oct.-Mar.) 9:00-1:00 Accepting Medicaid  [place] ([address]) [facility] [address], [place], [place] [address] [phone] Mon-Fri 8:00-5:00 Accepting Medicaid   [place] ([address]) [facility] [place] [address], [place], [place] [address] [phone] Mon-Fri 8:00-5:00 [facility] [place] [address], [place], [place] [address] [phone] Mon-Fri 8:00-5:00 [facility] [place] [address] [place] [address], [place],  [address] [phone] Mon-Fri 8:00-5:00 After hours clinic ([address] [place],  [address]) [phone] Mon-Fri 5:00-8:00, Sat 12:00-6:00, Sun 10:00-4:00 Accepting Medicaid [facility] [place]   [address], [place], [place]  [address] [phone]   Fax - [phone]  [place] ([address]) [facility] [place] [facility] [address], [place], [place] [address] [phone] Mon-Fri 8:00-5:00 [facility] [place] ([facility] [place]) [address], [place],  [address] [phone] Mon-Thur 8:00-7:00, Fri 8:00-5:00, Sat 8:00-12:00    

## 2021-04-01 NOTE — Assessment & Plan Note (Signed)
Numerous documented BV infections over the past several years. No douching or other poor hygenic habits. Denies any current symptoms of BV. Recommended twice weekly suppressive therapy with metrogel, can also do course of PO Flagyl if swab today returns positive. Trial boric acid if this fails.   Due for pap, collected today.  Due for mammogram, ordered and scheduled today.   Accepts STI screening by swab, defers serologies to future visit.

## 2021-04-02 LAB — CERVICOVAGINAL ANCILLARY ONLY
Bacterial Vaginitis (gardnerella): POSITIVE — AB
Candida Glabrata: NEGATIVE
Candida Vaginitis: POSITIVE — AB
Comment: NEGATIVE
Comment: NEGATIVE
Comment: NEGATIVE

## 2021-04-03 LAB — CYTOLOGY - PAP
Chlamydia: NEGATIVE
Comment: NEGATIVE
Comment: NEGATIVE
Comment: NEGATIVE
Comment: NORMAL
Diagnosis: NEGATIVE
High risk HPV: NEGATIVE
Neisseria Gonorrhea: NEGATIVE
Trichomonas: NEGATIVE

## 2021-04-03 MED ORDER — FLUCONAZOLE 150 MG PO TABS: 150.0000 mg | ORAL_TABLET | Freq: Once | ORAL | 0 refills | Status: AC

## 2021-04-04 MED ORDER — FLUCONAZOLE 150 MG PO TABS: 150.0000 mg | ORAL_TABLET | Freq: Once | ORAL | 0 refills | Status: AC

## 2021-04-08 ENCOUNTER — Encounter: Payer: Medicaid Other | Admitting: Obstetrics and Gynecology

## 2021-05-05 ENCOUNTER — Ambulatory Visit
Admission: RE | Admit: 2021-05-05 | Discharge: 2021-05-05 | Disposition: A | Discharge status: Home / Self Care | Payer: Medicaid Other | Source: Ambulatory Visit | Attending: Family Medicine | Admitting: Family Medicine

## 2021-05-05 ENCOUNTER — Other Ambulatory Visit: Payer: Self-pay

## 2021-05-05 DIAGNOSIS — Z01419 Encounter for gynecological examination (general) (routine) without abnormal findings: Secondary | ICD-10-CM

## 2021-05-08 ENCOUNTER — Encounter: Payer: Self-pay | Admitting: Family Medicine

## 2021-05-08 DIAGNOSIS — R928 Other abnormal and inconclusive findings on diagnostic imaging of breast: Secondary | ICD-10-CM | POA: Insufficient documentation

## 2021-05-09 ENCOUNTER — Other Ambulatory Visit: Payer: Self-pay | Admitting: Family Medicine

## 2021-05-09 DIAGNOSIS — R928 Other abnormal and inconclusive findings on diagnostic imaging of breast: Secondary | ICD-10-CM

## 2021-05-14 ENCOUNTER — Ambulatory Visit: Payer: Medicaid Other

## 2021-05-14 ENCOUNTER — Other Ambulatory Visit: Payer: Self-pay

## 2021-05-14 ENCOUNTER — Ambulatory Visit
Admission: RE | Admit: 2021-05-14 | Discharge: 2021-05-14 | Disposition: A | Discharge status: Home / Self Care | Payer: Medicaid Other | Source: Ambulatory Visit | Attending: Family Medicine | Admitting: Family Medicine

## 2021-05-14 DIAGNOSIS — R928 Other abnormal and inconclusive findings on diagnostic imaging of breast: Secondary | ICD-10-CM

## 2022-08-11 ENCOUNTER — Other Ambulatory Visit (HOSPITAL_COMMUNITY)
Admission: RE | Admit: 2022-08-11 | Discharge: 2022-08-11 | Disposition: A | Discharge status: Home / Self Care | Payer: Medicaid Other | Source: Ambulatory Visit | Attending: Internal Medicine | Admitting: Internal Medicine

## 2022-08-11 ENCOUNTER — Ambulatory Visit (INDEPENDENT_AMBULATORY_CARE_PROVIDER_SITE_OTHER): Payer: Medicaid Other | Admitting: Student

## 2022-08-11 VITALS — BP 136/89 | HR 82 | Temp 98.2°F | Wt 186.2 lb

## 2022-08-11 DIAGNOSIS — B9689 Other specified bacterial agents as the cause of diseases classified elsewhere: Secondary | ICD-10-CM | POA: Insufficient documentation

## 2022-08-11 DIAGNOSIS — Z23 Encounter for immunization: Secondary | ICD-10-CM

## 2022-08-11 DIAGNOSIS — Z13220 Encounter for screening for lipoid disorders: Secondary | ICD-10-CM

## 2022-08-11 DIAGNOSIS — N760 Acute vaginitis: Secondary | ICD-10-CM | POA: Diagnosis not present

## 2022-08-11 DIAGNOSIS — Z131 Encounter for screening for diabetes mellitus: Secondary | ICD-10-CM

## 2022-08-11 DIAGNOSIS — Z113 Encounter for screening for infections with a predominantly sexual mode of transmission: Secondary | ICD-10-CM

## 2022-08-11 DIAGNOSIS — L601 Onycholysis: Secondary | ICD-10-CM | POA: Diagnosis not present

## 2022-08-11 DIAGNOSIS — Z0000 Encounter for general adult medical examination without abnormal findings: Secondary | ICD-10-CM

## 2022-08-11 DIAGNOSIS — Z800 Family history of malignant neoplasm of digestive organs: Secondary | ICD-10-CM | POA: Diagnosis not present

## 2022-08-11 DIAGNOSIS — Z1159 Encounter for screening for other viral diseases: Secondary | ICD-10-CM

## 2022-08-11 NOTE — Patient Instructions (Addendum)
Thank you, Ms.Arieal ADRIANNE SHACKLETON for allowing [place] to provide your care today. Today we discussed   You well-woman exam - I am screening you for cholesterol and diabetes -I am screening you for colonoscopy because your family history - keep an eye on your blood pressure, diet, and exercise -Screening for HIV and Hepatitis C -You received your flu shot today  Nails and hands - sending you to dermatologist to evaluate you for fungus vs a condition called psoriasis  Vaginal discharge -will test you for bacterial vaginosis and for sexually transmitted diseases  I have ordered the following labs for you:   Lab Orders         Hemoglobin A1c         Lipid Profile         HIV antibody (with reflex)         Hepatitis C Ab reflex to Quant PCR      I will call if any are abnormal. All of your labs can be accessed through "My Chart".  I have place a referrals to dermatology for your nail and hand condition and to gastroenterology for a colonoscopy  My Chart Access: [email or web address]  Please follow-up in one year  Please make sure to arrive 15 minutes prior to your next appointment. If you arrive late, you may be asked to reschedule.    We look forward to seeing you next time. Please call our clinic at [phone] if you have any questions or concerns. The best time to call is Monday-Friday from 9am-4pm, but there is someone available 24/7. If after hours or the weekend, call the main hospital number and ask for the Internal Medicine Resident On-Call. If you need medication refills, please notify your pharmacy one week in advance and they will send [place] a request.   Thank you for letting us take part in your care. Wishing you the best!  Romana Juniper, MD 08/11/2022, 4:44 PM [facility] Internal Medicine Resident, PGY-1

## 2022-08-11 NOTE — Progress Notes (Unsigned)
CC: establish care  HPI:   Ms.Destiny Wallace is a 42 y.o. woman with a PMHX significant for recurrent bacterial vaginosis presenting to the clinic to establish medical care. She has been seen seen in OBGYN for women's health care but has not established with PCP before. Today, she reports feeling in good state of health, except for vaginal discharge and chronic nail changes. 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   HENT: Negative.    Eyes: Negative.   Respiratory: Negative.    Cardiovascular: Negative.   Gastrointestinal: Negative.  Negative for abdominal pain, blood in stool, constipation, melena, nausea and vomiting.  Genitourinary:        Increased white-clear vaginal discharge with foul smell  Musculoskeletal: Negative.   Skin: Negative.   Neurological: Negative.   Endo/Heme/Allergies: Negative.   Psychiatric/Behavioral: Negative.       Physical Exam:  Vitals:   08/11/22 1557  BP: 136/89  Pulse: 82  Temp: 98.2 F (36.8 C)  TempSrc: Oral  SpO2: 100%  Weight: 186 lb 3.2 oz (84.5 kg)    General: Pleasant, well-appearing woman laying in bed. No acute distress. Head: Normocephalic. Atraumatic. CV: RRR. No murmurs, rubs, or gallops. No LE edema Pulmonary: Lungs CTAB. Normal effort. No wheezing or  rales. Abdominal: Soft, nontender, nondistended. Normal bowel sounds. Extremities: Palpable radial and DP pulses. Normal ROM. Skin: Warm and dry. No obvious rash. Please media tab 08/11/22 for pictures depicting onycholysis, hyperkeratosis, and melanotic hue of R finger nails and bilateral toe nails. Neuro: A&Ox3. Moves all extremities. Normal sensation. No focal deficit. Psych: Normal mood and affect  Assessment & Plan:   See Encounters Tab for problem based charting.  Recurrent BV Patient with recurrence of of foul-smelling clear to white discharge in the past couple of weeks. Patient has a history of recurent BV infections. She has been on suppressive therapy with metronidazole gel until 2022 when she last saw her OBGYN. Patient would like to get her GYN care in this office now.   Given she does not use barrier protection and is not currently monogamous, patient would also like to be tested for STIs.   She denies dyspareunia, erythema, pruritus, bleeding. She has has a tubal ligation in the past.   -cervicovaginal swab + BV, negative for G/C, trichomonas -PO flagyll will be sent for 7 days -Will resume suppressive therapy thereafter  Onycholysis Unable to import media, but see media tab for photos 08/12/2022. Patient with a chronic history of onycholysis with melanosis, hyperkeratosis in the nails of the R hand and of bilateral toenails. Patient is also presenting with thickened palms and soles. No nail pitting, blubbing, koilonychia, mees lines, palmar erythema, hyperpigmentation of knucles. High clinical suspicion for onychomycosis, however, psoriasis also in my differential. Given chronicity and multiple site involvement, favor referral to dermatology to  rule out systemic diseases. -Placed referral to Dermatology  Healthcare maintenance Flu shot at OV HIV, Hep C - negative A1c wnl  Lipid Panel     Component Value Date/Time   CHOL 232 (H) 08/11/2022 1632   TRIG 71 08/11/2022  1632   HDL 70 08/11/2022 1632   CHOLHDL 3.3 08/11/2022 1632   LDLCALC 150 (H) 08/11/2022 1632   LABVLDL 12 08/11/2022 1632   The 10-year ASCVD risk score (Arnett DK, et al., 2019) is: 1.6%   Values used to calculate the score:     Age: 27 years     Sex: Female     Is Non-Hispanic African American: Yes     Diabetic: No     Tobacco smoker: Yes     Systolic Blood Pressure: 774 mmHg     Is BP treated: No     HDL Cholesterol: 70 mg/dL     Total Cholesterol: 232 mg/dL Will continue to monitor Lipid panel every 3-5 years.  Family history of colorectal cancer Multiple first and second degree family members with hx of colon cancer 40-60 years  -Referral to GI for early screening colonoscopy    Diagnoses and all orders for this visit:  Recurrent BV -     Cervicovaginal ancillary only  Screening, lipid -     Lipid Profile  Screening for diabetes mellitus -     Hemoglobin A1c  Screening for STD (sexually transmitted disease) -     HIV antibody (with reflex)  Encounter for hepatitis C screening test for low risk patient -     Hepatitis C Ab reflex to Quant PCR  Family history of colon cancer requiring screening colonoscopy -     Ambulatory referral to Gastroenterology  Onycholysis -     Ambulatory referral to Dermatology  Need for immunization against influenza -     Flu Vaccine QUAD 53mo+IM (Fluarix, Fluzone & Alfiuria Quad PF)  Healthcare maintenance  Family history of colorectal cancer    Return if symptoms worsen or fail to improve.   Patient seen with Dr. Heber Essex

## 2022-08-12 ENCOUNTER — Encounter: Payer: Self-pay | Admitting: Student

## 2022-08-12 ENCOUNTER — Other Ambulatory Visit: Payer: Self-pay | Admitting: Student

## 2022-08-12 DIAGNOSIS — L601 Onycholysis: Secondary | ICD-10-CM | POA: Insufficient documentation

## 2022-08-12 DIAGNOSIS — Z800 Family history of malignant neoplasm of digestive organs: Secondary | ICD-10-CM | POA: Insufficient documentation

## 2022-08-12 DIAGNOSIS — Z0000 Encounter for general adult medical examination without abnormal findings: Secondary | ICD-10-CM | POA: Insufficient documentation

## 2022-08-12 LAB — CERVICOVAGINAL ANCILLARY ONLY
Bacterial Vaginitis (gardnerella): POSITIVE — AB
Candida Glabrata: NEGATIVE
Candida Vaginitis: NEGATIVE
Chlamydia: NEGATIVE
Comment: NEGATIVE
Comment: NEGATIVE
Comment: NEGATIVE
Comment: NEGATIVE
Comment: NEGATIVE
Comment: NORMAL
Neisseria Gonorrhea: NEGATIVE
Trichomonas: NEGATIVE

## 2022-08-12 MED ORDER — METRONIDAZOLE 500 MG PO TABS: 500.0000 mg | ORAL_TABLET | Freq: Two times a day (BID) | ORAL | 0 refills | Status: AC

## 2022-08-12 MED ORDER — METRONIDAZOLE 0.75 % VA GEL: 1.0000 | VAGINAL | 3 refills | Status: DC

## 2022-08-12 NOTE — Assessment & Plan Note (Addendum)
Multiple first and second degree family members with hx of colon cancer 40-60 years  -Referral to GI for early screening colonoscopy

## 2022-08-12 NOTE — Assessment & Plan Note (Signed)
Flu shot at OV HIV, Hep C - negative A1c wnl  Lipid Panel     Component Value Date/Time   CHOL 232 (H) 08/11/2022 1632   TRIG 71 08/11/2022 1632   HDL 70 08/11/2022 1632   CHOLHDL 3.3 08/11/2022 1632   LDLCALC 150 (H) 08/11/2022 1632   LABVLDL 12 08/11/2022 1632   The 10-year ASCVD risk score (Arnett DK, et al., 2019) is: 1.6%   Values used to calculate the score:     Age: 42 years     Sex: Female     Is Non-Hispanic African American: Yes     Diabetic: No     Tobacco smoker: Yes     Systolic Blood Pressure: 929 mmHg     Is BP treated: No     HDL Cholesterol: 70 mg/dL     Total Cholesterol: 232 mg/dL Will continue to monitor Lipid panel every 3-5 years.

## 2022-08-12 NOTE — Assessment & Plan Note (Signed)
Unable to import media, but see media tab for photos 08/12/2022. Patient with a chronic history of onycholysis with melanosis, hyperkeratosis in the nails of the R hand and of bilateral toenails. Patient is also presenting with thickened palms and soles. No nail pitting, blubbing, koilonychia, mees lines, palmar erythema, hyperpigmentation of knucles. High clinical suspicion for onychomycosis, however, psoriasis also in my differential. Given chronicity and multiple site involvement, favor referral to dermatology to rule out systemic diseases. -Placed referral to Dermatology

## 2022-08-12 NOTE — Assessment & Plan Note (Signed)
Patient with recurrence of of foul-smelling clear to white discharge in the past couple of weeks. Patient has a history of recurent BV infections. She has been on suppressive therapy with metronidazole gel until 2022 when she last saw her OBGYN. Patient would like to get her GYN care in this office now.   Given she does not use barrier protection and is not currently monogamous, patient would also like to be tested for STIs.   She denies dyspareunia, erythema, pruritus, bleeding. She has has a tubal ligation in the past.   -cervicovaginal swab + BV, negative for G/C, trichomonas -PO flagyll will be sent for 7 days -Will resume suppressive therapy thereafter

## 2022-08-13 NOTE — Progress Notes (Signed)
The 10-year ASCVD risk score (Arnett DK, et al., 2019) is: 1.6% Counseled patient on lifestyle modifications. Prescription for BV sent

## 2022-08-15 LAB — HCV AB W REFLEX TO QUANT PCR: HCV Ab: NONREACTIVE

## 2022-08-15 LAB — HIV ANTIBODY (ROUTINE TESTING W REFLEX): HIV Screen 4th Generation wRfx: NONREACTIVE

## 2022-08-15 LAB — HCV INTERPRETATION

## 2022-08-15 LAB — HEMOGLOBIN A1C
Est. average glucose Bld gHb Est-mCnc: 108 mg/dL
Hgb A1c MFr Bld: 5.4 % (ref 4.8–5.6)

## 2022-08-15 LAB — LIPID PANEL
Chol/HDL Ratio: 3.3 ratio (ref 0.0–4.4)
Cholesterol, Total: 232 mg/dL — ABNORMAL HIGH (ref 100–199)
HDL: 70 mg/dL (ref >39)
LDL Chol Calc (NIH): 150 mg/dL — ABNORMAL HIGH (ref 0–99)
Triglycerides: 71 mg/dL (ref 0–149)
VLDL Cholesterol Cal: 12 mg/dL (ref 5–40)

## 2022-08-17 NOTE — Progress Notes (Signed)
Internal Medicine Clinic Attending  I saw and evaluated the patient.  I personally confirmed the key portions of the history and exam documented by the resident  and I reviewed pertinent patient test results.  The assessment, diagnosis, and plan were formulated together and I agree with the documentation in the resident's note.

## 2022-09-07 NOTE — Progress Notes (Unsigned)
   CC: One month follow up  HPI:  Destiny Wallace is a 42 y.o. with medical history of Gestational hypertension, BV presenting to [facility] for a one month follow up. Established care 08/11/22.  Please see problem-based list for further details, assessments, and plans.  Past Medical History:  Diagnosis Date   Hypertension during pregnancy      Current Outpatient Medications (Cardiovascular):    EPINEPHrine 0.3 mg/0.3 mL IJ SOAJ injection, Inject 0.3 mg into the muscle as needed for anaphylaxis.     Current Outpatient Medications (Other):    metroNIDAZOLE (METROGEL) 0.75 % vaginal gel, Place 1 Applicatorful vaginally 2 (two) times a week for 28 doses.  Review of Systems:  Review of system negative unless stated in the problem list or HPI.    Physical Exam:  Vitals:   09/08/22 1519  BP: 123/66  Pulse: 72  Temp: 98 F (36.7 C)  TempSrc: Oral  SpO2: 100%  Weight: 187 lb 3.2 oz (84.9 kg)  Height: 5' 7"$  (1.702 m)    Physical Exam General: NAD HENT: NCAT Lungs: CTAB, no wheeze, rhonchi or rales.  Cardiovascular: Normal heart sounds, no r/m/g, 2+ pulses in all extremities. No LE edema Abdomen: No TTP, normal bowel sounds MSK: No asymmetry or muscle atrophy.  Skin: no lesions noted on exposed skin Neuro: Alert and oriented x4. CN grossly intact Psych: Normal mood and normal affect   Assessment & Plan:   No problem-specific Assessment & Plan notes found for this encounter.   See Encounters Tab for problem based charting.  Patient Discussed with Dr. Edrick Kins, MD Tillie Rung. [facility] Internal Medicine Residency, PGY-2   BV Pt given PO flagyl. Was to start suppressive therapy after that.   Mammogram Tdap

## 2022-09-08 ENCOUNTER — Other Ambulatory Visit: Payer: Self-pay

## 2022-09-08 ENCOUNTER — Encounter: Payer: Self-pay | Admitting: Internal Medicine

## 2022-09-08 ENCOUNTER — Ambulatory Visit (INDEPENDENT_AMBULATORY_CARE_PROVIDER_SITE_OTHER): Payer: Medicaid Other | Admitting: Internal Medicine

## 2022-09-08 VITALS — BP 123/66 | HR 72 | Temp 98.0°F | Ht 67.0 in | Wt 187.2 lb

## 2022-09-08 DIAGNOSIS — Z23 Encounter for immunization: Secondary | ICD-10-CM | POA: Diagnosis present

## 2022-09-08 DIAGNOSIS — R030 Elevated blood-pressure reading, without diagnosis of hypertension: Secondary | ICD-10-CM

## 2022-09-08 DIAGNOSIS — N760 Acute vaginitis: Secondary | ICD-10-CM

## 2022-09-08 DIAGNOSIS — Z0000 Encounter for general adult medical examination without abnormal findings: Secondary | ICD-10-CM

## 2022-09-08 DIAGNOSIS — B9689 Other specified bacterial agents as the cause of diseases classified elsewhere: Secondary | ICD-10-CM

## 2022-09-08 NOTE — Patient Instructions (Signed)
Ms.Eliyah ERIKAH ROYBAL, it was a pleasure seeing you today! You endorsed feeling well today. Below are some of the things we talked about this visit. We look forward to seeing you in the follow up appointment!  Today we discussed: Your blood pressure looks great! Keep up the great work. We will give you a tetanus shot today.  Follow up in one year or earlier if needed.   I have ordered the following labs today:  Lab Orders  No laboratory test(s) ordered today      Referrals ordered today:   Referral Orders  No referral(s) requested today     I have ordered the following medication/changed the following medications:   Stop the following medications: There are no discontinued medications.   Start the following medications: No orders of the defined types were placed in this encounter.    Follow-up: 1 year follow up with Dr. Altamease Oiler  Please make sure to arrive 15 minutes prior to your next appointment. If you arrive late, you may be asked to reschedule.   We look forward to seeing you next time. Please call our clinic at [phone] if you have any questions or concerns. The best time to call is Monday-Friday from 9am-4pm, but there is someone available 24/7. If after hours or the weekend, call the main hospital number and ask for the Internal Medicine Resident On-Call. If you need medication refills, please notify your pharmacy one week in advance and they will send [place] a request.  Thank you for letting us take part in your care. Wishing you the best!  Thank you, Idamae Schuller, MD

## 2022-09-09 DIAGNOSIS — R030 Elevated blood-pressure reading, without diagnosis of hypertension: Secondary | ICD-10-CM | POA: Insufficient documentation

## 2022-09-09 NOTE — Assessment & Plan Note (Signed)
Pt has recurrent BV and during the last episode was given PO flagyl. She states she last received the suppressive medications several months ago. She was restarted suppressive therapy after that. States her symptoms have resolved.

## 2022-09-09 NOTE — Assessment & Plan Note (Signed)
Tdap given this visit.

## 2022-09-09 NOTE — Assessment & Plan Note (Signed)
Had elevated reading during the last visit. This appears to have normalized.

## 2022-09-10 NOTE — Progress Notes (Signed)
Internal Medicine Clinic Attending  Case discussed with Dr. Khan  At the time of the visit.  We reviewed the resident's history and exam and pertinent patient test results.  I agree with the assessment, diagnosis, and plan of care documented in the resident's note.  

## 2022-10-19 ENCOUNTER — Encounter: Payer: Self-pay | Admitting: Internal Medicine

## 2023-03-26 ENCOUNTER — Ambulatory Visit
Admission: EM | Admit: 2023-03-26 | Discharge: 2023-03-26 | Disposition: A | Discharge status: Home / Self Care | Payer: Medicaid Other | Attending: Physician Assistant | Admitting: Physician Assistant

## 2023-03-26 DIAGNOSIS — M545 Low back pain, unspecified: Secondary | ICD-10-CM | POA: Insufficient documentation

## 2023-03-26 DIAGNOSIS — N898 Other specified noninflammatory disorders of vagina: Secondary | ICD-10-CM

## 2023-03-26 LAB — POCT URINALYSIS DIP (MANUAL ENTRY)
Bilirubin, UA: NEGATIVE
Glucose, UA: NEGATIVE mg/dL
Ketones, POC UA: NEGATIVE mg/dL
Nitrite, UA: NEGATIVE
Protein Ur, POC: 100 mg/dL — AB
Spec Grav, UA: >=1.03 — AB (ref 1.010–1.025)
Urobilinogen, UA: 0.2 U/dL
pH, UA: 6 (ref 5.0–8.0)

## 2023-03-26 NOTE — ED Triage Notes (Signed)
"  For about 9 days I have been having lower back pain, no injury. No urinary problems. I am also having a lot of vaginal discharge, that is abnormal for me". No concern for STI.

## 2023-03-26 NOTE — ED Provider Notes (Signed)
[facility]    CSN: [id number] Arrival date & time: 03/26/23  1626      History   Chief Complaint Chief Complaint  Patient presents with   Vaginal Discharge   Back Pain    HPI Destiny Wallace is a 42 y.o. female.   Patient here today for evaluation of lower back pain.  She denies any injury and states she has had symptoms for about 9 days.  She denies any dysuria or urinary frequency but has had some vaginal discharge which is abnormal for her.  She denies concerns for STDs.  She has had tubal ligation.  The history is provided by the patient.  Vaginal Discharge Associated symptoms: no abdominal pain, no dysuria, no fever, no nausea and no vomiting   Back Pain Associated symptoms: no abdominal pain, no dysuria and no fever     Past Medical History:  Diagnosis Date   Hypertension during pregnancy     Patient Active Problem List   Diagnosis Date Noted   Blood pressure elevated without history of HTN 09/09/2022   Onycholysis 08/12/2022   Healthcare maintenance 08/12/2022   Family history of colorectal cancer 08/12/2022   Abnormal mammogram 05/08/2021   Recurrent BV 04/01/2021    Past Surgical History:  Procedure Laterality Date   CIRCLAGE     TUBAL LIGATION      OB History     Gravida  2   Para  2   Term  2   Preterm  0   AB  0   Living  2      SAB  0   IAB      Ectopic  0   Multiple  0   Live Births  2            Home Medications    Prior to Admission medications   Medication Sig Start Date End Date Taking? Authorizing Provider  acetaminophen (TYLENOL) 650 MG CR tablet Take 650 mg by mouth every 8 (eight) hours as needed for pain.   Yes [provider]  ibuprofen (ADVIL) 800 MG tablet Take 800 mg by mouth every 8 (eight) hours as needed.   Yes [provider]  traMADol (ULTRAM-ER) 100 MG 24 hr tablet Take 100 mg by mouth daily. "I am taking a friends due to pain". ? Mg.  Last taken: 09-11 HS.   Yes  [provider]    Family History Family History  Problem Relation Age of Onset   Asthma Mother    Hypertension Mother    Anxiety disorder Mother     Social History Social History   Tobacco Use   Smoking status: Never   Smokeless tobacco: Never  Vaping Use   Vaping status: Never Used  Substance Use Topics   Alcohol use: Not Currently    Comment: Occassionally.   Drug use: Yes    Types: Marijuana    Comment: Last time used: Beginning of Sept. 2024     Allergies   Patient has no known allergies.   Review of Systems Review of Systems  Constitutional:  Negative for chills and fever.  Eyes:  Negative for discharge and redness.  Gastrointestinal:  Negative for abdominal pain, nausea and vomiting.  Genitourinary:  Positive for vaginal discharge. Negative for dysuria.  Musculoskeletal:  Positive for back pain.     Physical Exam Triage Vital Signs ED Triage Vitals  Encounter Vitals Group     BP 03/26/23 1636 (!) 141/79  Systolic BP Percentile --      Diastolic BP Percentile --      Pulse Rate 03/26/23 1636 69     Resp 03/26/23 1636 18     Temp 03/26/23 1636 98.7 F (37.1 C)     Temp Source 03/26/23 1636 Oral     SpO2 03/26/23 1636 98 %     Weight 03/26/23 1633 187 lb 2.7 oz (84.9 kg)     Height 03/26/23 1633 5\' 7"  (1.702 m)     Head Circumference --      Peak Flow --      Pain Score 03/26/23 1631 4     Pain Loc --      Pain Education --      Exclude from Growth Chart --    No data found.  Updated Vital Signs BP (!) 141/79 (BP Location: Left Arm)   Pulse 69   Temp 98.7 F (37.1 C) (Oral)   Resp 18   Ht 5\' 7"  (1.702 m)   Wt 187 lb 2.7 oz (84.9 kg)   LMP 03/05/2023 (Approximate)   SpO2 98%   BMI 29.32 kg/m      Physical Exam Vitals and nursing note reviewed.  Constitutional:      General: She is not in acute distress.    Appearance: Normal appearance. She is not ill-appearing.  HENT:     Head: Normocephalic and atraumatic.  Eyes:      Conjunctiva/sclera: Conjunctivae normal.  Cardiovascular:     Rate and Rhythm: Normal rate.  Pulmonary:     Effort: Pulmonary effort is normal. No respiratory distress.  Neurological:     Mental Status: She is alert.  Psychiatric:        Mood and Affect: Mood normal.        Behavior: Behavior normal.        Thought Content: Thought content normal.      UC Treatments / Results  Labs (all labs ordered are listed, but only abnormal results are displayed) Labs Reviewed  POCT URINALYSIS DIP (MANUAL ENTRY) - Abnormal; Notable for the following components:      Result Value   Clarity, UA cloudy (*)    Spec Grav, UA >=1.030 (*)    Blood, UA trace-intact (*)    Protein Ur, POC =100 (*)    Leukocytes, UA Trace (*)    All other components within normal limits  CERVICOVAGINAL ANCILLARY ONLY    EKG   Radiology No results found.  Procedures Procedures (including critical care time)  Medications Ordered in UC Medications - No data to display  Initial Impression / Assessment and Plan / UC Course  I have reviewed the triage vital signs and the nursing notes.  Pertinent labs & imaging results that were available during my care of the patient were reviewed by me and considered in my medical decision making (see chart for details).    UA ordered with only trace leukocytes, low suspicion for UTI.  Will order cervicovaginal swab for further evaluation vaginal discharge.  Will await results further recommendation.  Encouraged symptomatic treatment at home for low back pain and follow-up should symptoms worsen or fail to improve.  Final Clinical Impressions(s) / UC Diagnoses   Final diagnoses:  Vaginal discharge   Discharge Instructions   None    ED Prescriptions   None    PDMP not reviewed this encounter.   Tomi Bamberger, PA-C 03/28/23 [phone]

## 2023-03-28 ENCOUNTER — Encounter: Payer: Self-pay | Admitting: Physician Assistant

## 2023-03-29 LAB — CERVICOVAGINAL ANCILLARY ONLY
Bacterial Vaginitis (gardnerella): POSITIVE — AB
Candida Glabrata: NEGATIVE
Candida Vaginitis: NEGATIVE
Chlamydia: NEGATIVE
Comment: NEGATIVE
Comment: NEGATIVE
Comment: NEGATIVE
Comment: NEGATIVE
Comment: NEGATIVE
Comment: NORMAL
Neisseria Gonorrhea: NEGATIVE
Trichomonas: NEGATIVE

## 2023-03-30 ENCOUNTER — Telehealth: Payer: Self-pay

## 2023-03-30 ENCOUNTER — Other Ambulatory Visit: Payer: Self-pay | Admitting: Student

## 2023-03-30 MED ORDER — METRONIDAZOLE 500 MG PO TABS: 500.0000 mg | ORAL_TABLET | Freq: Two times a day (BID) | ORAL | 0 refills | Status: DC

## 2023-03-30 NOTE — Telephone Encounter (Signed)
Pt tested positive for BV. Per protocol  will call in Flagyl to help with symptoms. Pt has been advised to not drink any alcohol while taking this medication.

## 2023-04-01 IMAGING — MG MM DIGITAL DIAGNOSTIC UNILAT*R* W/ TOMO W/ CAD
4 series · 4 of 12 positions shown · non-contrast
Comparison: Previous exam(s).

CLINICAL DATA: 39-year-old female recalled from baseline screening
mammogram dated 05/05/2021 for a possible right breast asymmetry.

EXAM:
DIGITAL DIAGNOSTIC UNILATERAL RIGHT MAMMOGRAM WITH TOMOSYNTHESIS AND
CAD
TECHNIQUE: Right digital diagnostic mammography and breast tomosynthesis was
performed. The images were evaluated with computer-aided detection.

[R ML synth-2D]
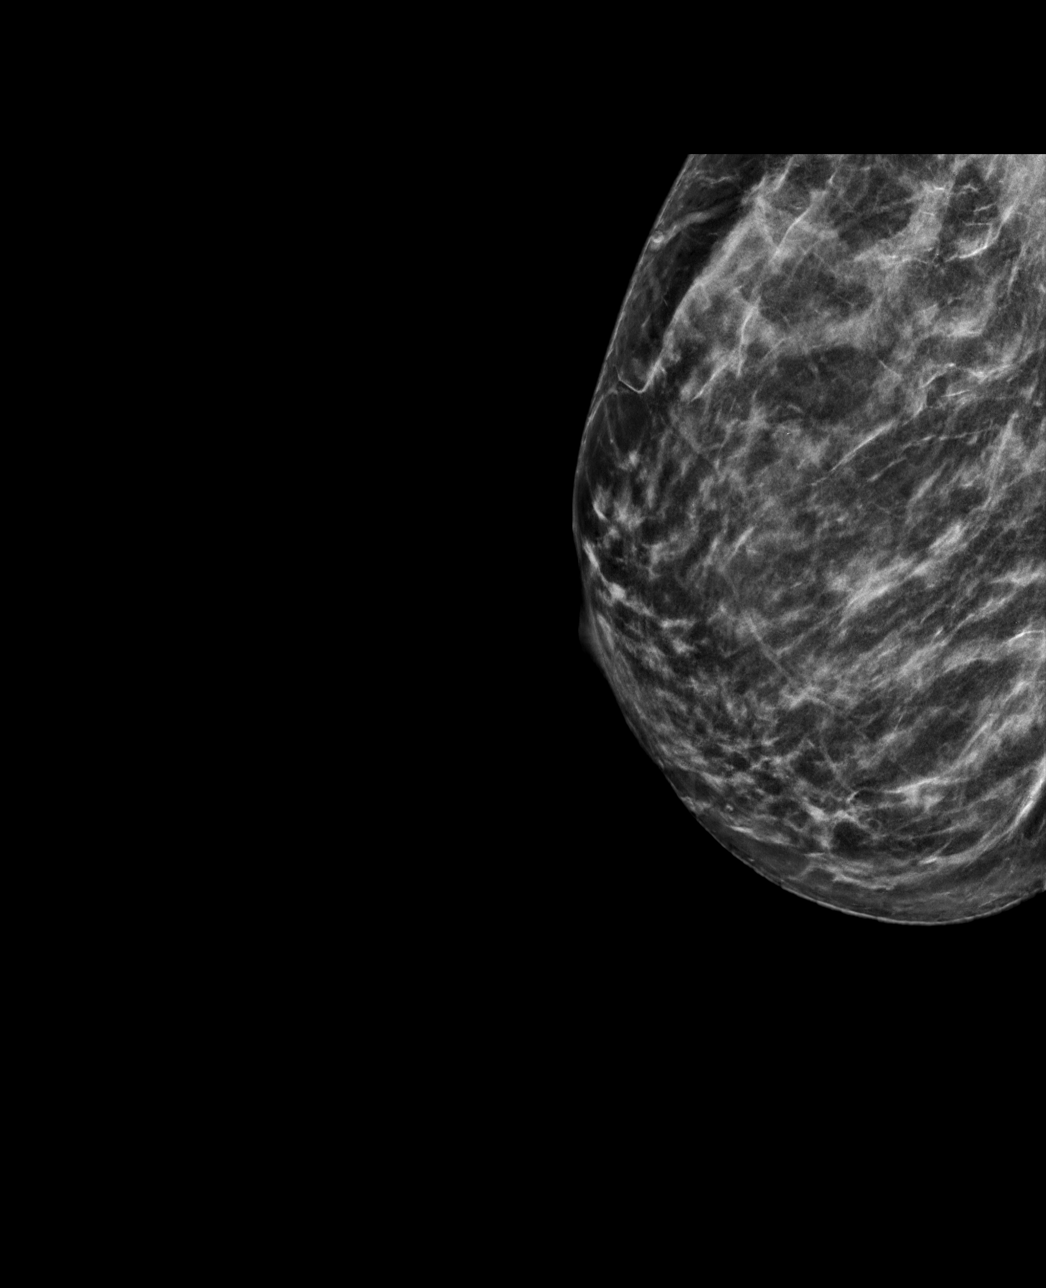

[R MLO synth-2D]
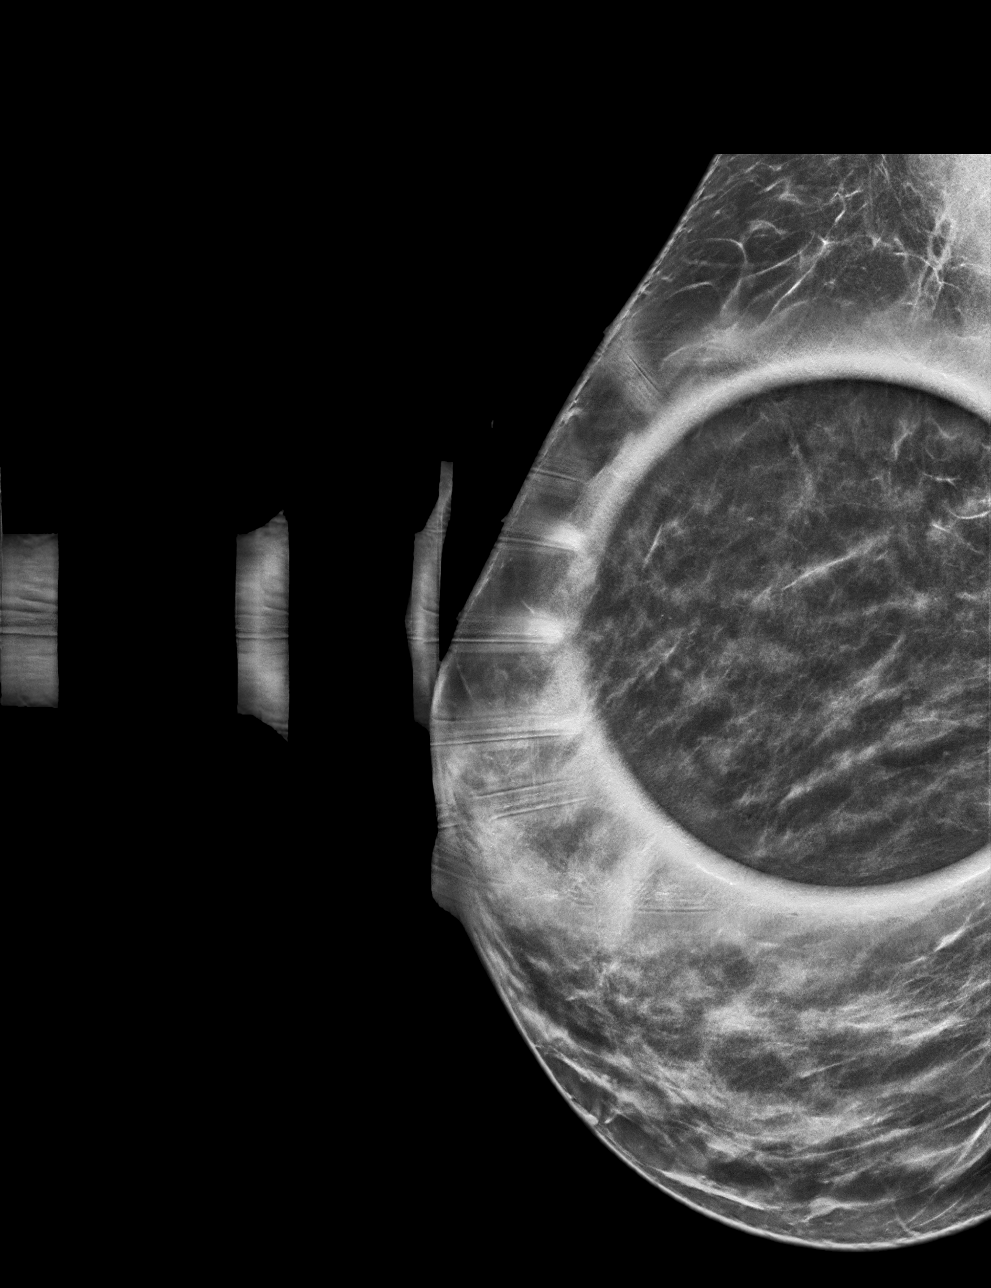

[R ML tomo · tomo slice 37/72.0]
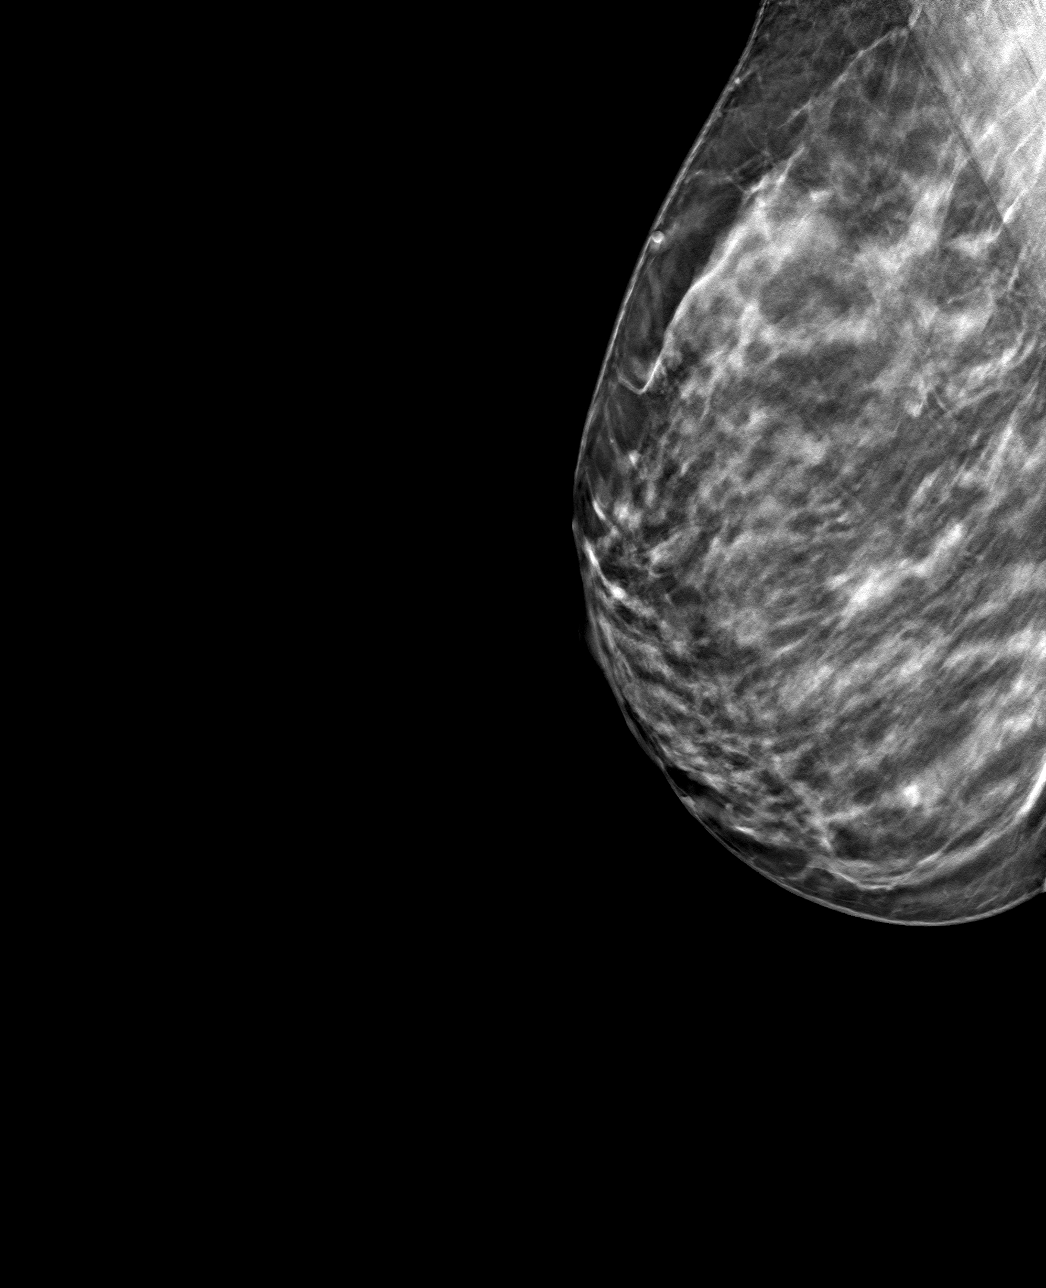

[R MLO tomo · tomo slice 31/62.0]
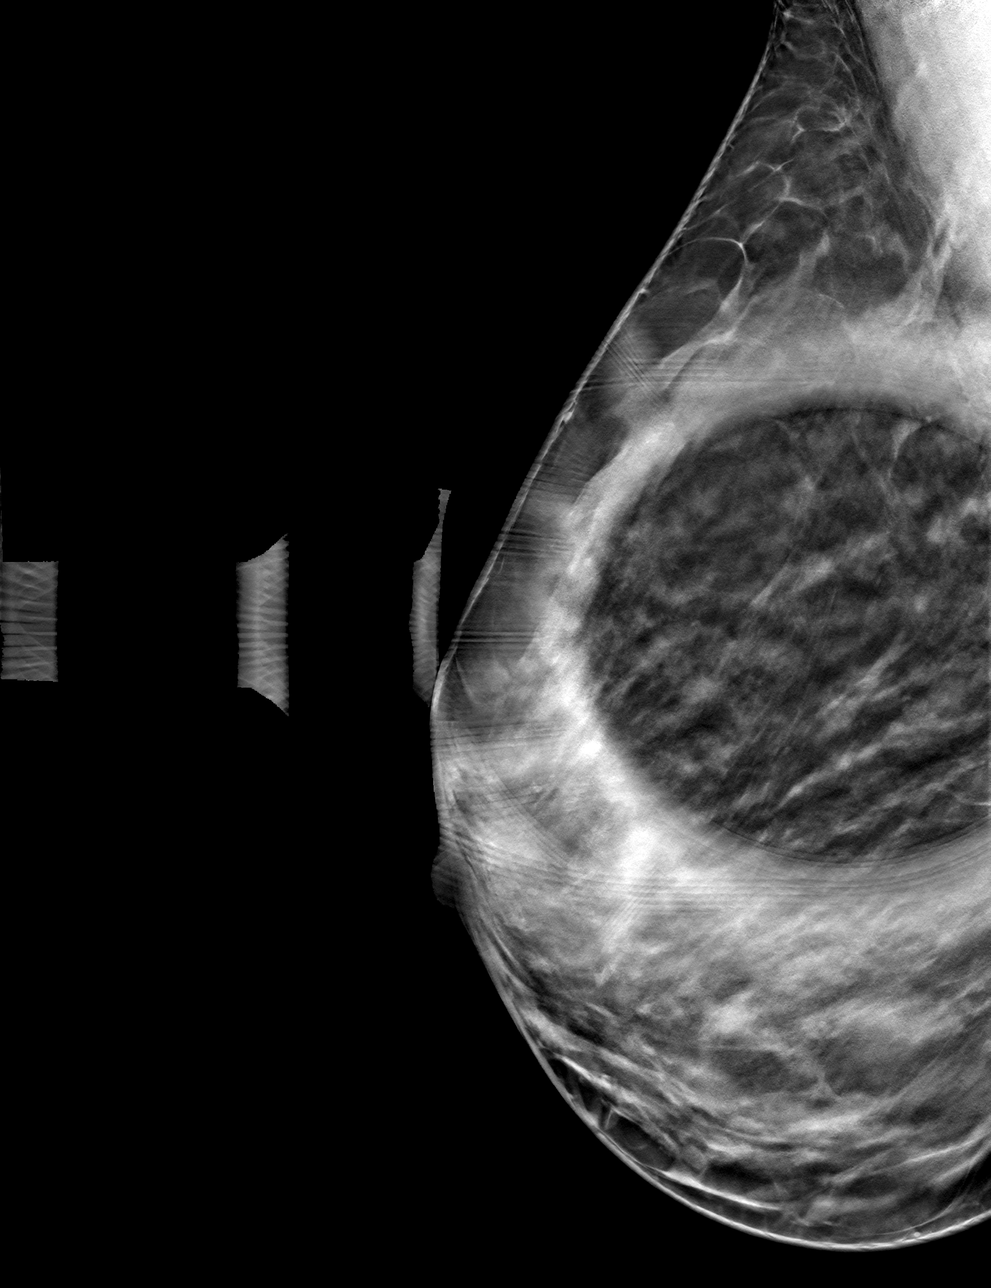

[4 of 12 positions shown; findings below may reference images not displayed]

ACR Breast Density Category c: The breast tissue is heterogeneously
dense, which may obscure small masses.
FINDINGS: Previously described, possible asymmetry in the deep central right
breast is seen on the MLO projection resolves into well dispersed
fibroglandular tissue on today's additional views. No suspicious
findings are identified.
IMPRESSION: No mammographic evidence of malignancy.

RECOMMENDATION:
Screening mammogram in one year.(Code:[id number])

I have discussed the findings and recommendations with the patient.
If applicable, a reminder letter will be sent to the patient
regarding the next appointment.

BI-RADS CATEGORY  1: Negative.

## 2023-04-12 ENCOUNTER — Ambulatory Visit: Payer: Medicaid Other | Admitting: Student

## 2023-04-12 ENCOUNTER — Encounter: Payer: Self-pay | Admitting: Student

## 2023-04-12 ENCOUNTER — Other Ambulatory Visit (HOSPITAL_COMMUNITY)
Admission: RE | Admit: 2023-04-12 | Discharge: 2023-04-12 | Disposition: A | Discharge status: Home / Self Care | Payer: Medicaid Other | Source: Ambulatory Visit | Attending: Internal Medicine | Admitting: Internal Medicine

## 2023-04-12 VITALS — BP 112/56 | HR 78 | Temp 98.5°F | Ht 67.0 in | Wt 166.1 lb

## 2023-04-12 DIAGNOSIS — B9689 Other specified bacterial agents as the cause of diseases classified elsewhere: Secondary | ICD-10-CM | POA: Diagnosis not present

## 2023-04-12 DIAGNOSIS — S39012D Strain of muscle, fascia and tendon of lower back, subsequent encounter: Secondary | ICD-10-CM | POA: Diagnosis not present

## 2023-04-12 DIAGNOSIS — N760 Acute vaginitis: Secondary | ICD-10-CM | POA: Diagnosis present

## 2023-04-12 DIAGNOSIS — L601 Onycholysis: Secondary | ICD-10-CM

## 2023-04-12 DIAGNOSIS — Z86711 Personal history of pulmonary embolism: Secondary | ICD-10-CM | POA: Diagnosis not present

## 2023-04-12 MED ORDER — ACETAMINOPHEN 500 MG PO TABS
1000.0000 mg | ORAL_TABLET | Freq: Four times a day (QID) | ORAL | 2 refills | Status: DC | PRN
Start: 2023-04-12 — End: 2023-12-08

## 2023-04-12 MED ORDER — METHOCARBAMOL 750 MG PO TABS
750.0000 mg | ORAL_TABLET | Freq: Four times a day (QID) | ORAL | 1 refills | Status: DC | PRN
Start: 2023-04-12 — End: 2023-12-08

## 2023-04-12 NOTE — Patient Instructions (Addendum)
  Thank you, Ms.Kaylani DIAMON REDDINGER, for allowing [place] to provide your care today. Today we discussed . . .  > Back pain       -Your back pain is most likely due to strained and irritated muscles in your low back.  I would like you to start physical therapy and I have sent a referral for this today.  You can take Tylenol 1000 mg every 8 hours and I have sent a prescription for Robaxin 750 mg which you can take every 6 hours as needed.  If needed you can take ibuprofen 400-600 mg every 6 hours as needed if these medications are not giving you any relief.  Please let us know if you are having to take ibuprofen every day.  Please refrain from taking tramadol, Tylenol 3, and the other muscle relaxer that you have. > Vaginal discharge       -We will call you with the results of your swab and we will discuss treatment based on this.   Referrals ordered today:   Referral Orders         Ambulatory referral to Physical Therapy       I have ordered the following medication/changed the following medications:   Start the following medications: Meds ordered this encounter  Medications   acetaminophen (TYLENOL) 500 MG tablet    Sig: Take 2 tablets (1,000 mg total) by mouth every 6 (six) hours as needed.    Dispense:  100 tablet    Refill:  2   methocarbamol (ROBAXIN) 750 MG tablet    Sig: Take 1 tablet (750 mg total) by mouth every 6 (six) hours as needed for muscle spasms.    Dispense:  60 tablet    Refill:  1      Follow up: 2-3 months    Remember:     Should you have any questions or concerns please call the internal medicine clinic at [phone].     Rocky Morel, DO [facility]

## 2023-04-12 NOTE — Progress Notes (Unsigned)
   CC: Routine Follow Up   HPI:  Destiny Wallace is a 42 y.o. female with pertinent PMH of recurrent DVT and onycholysis who presents to the clinic for a follow-up visit. Please see assessment and plan below for further details.  Past Medical History:  Diagnosis Date   Hypertension during pregnancy     Current Outpatient Medications  Medication Instructions   acetaminophen (TYLENOL) 1,000 mg, Oral, Every 6 hours PRN   ibuprofen (ADVIL) 400-800 mg, Oral, Every 8 hours PRN   methocarbamol (ROBAXIN) 750 mg, Oral, Every 6 hours PRN     Review of Systems:   Pertinent items noted in HPI and/or A&P.  Physical Exam:  Vitals:   04/12/23 1603  BP: (!) 112/56  Pulse: 78  Temp: 98.5 F (36.9 C)  TempSrc: Oral  SpO2: 100%  Weight: 166 lb 1.6 oz (75.3 kg)  Height: 5\' 7"  (1.702 m)    Constitutional: Well-appearing adult female. In no acute distress. HEENT: Normocephalic, atraumatic, Sclera non-icteric, PERRL, EOM intact Cardio:Regular rate and rhythm. 2+ bilateral radial and dorsalis pedis  pulses. Pulm:Clear to auscultation bilaterally. Normal work of breathing on room air. Abdomen: Soft, non-tender, non-distended, positive bowel sounds. YNW:GNFAOZHY for extremity edema. Skin:Warm and dry.  Stable onycholysis, hyperkeratosis, and melanotic you right fingernails and bilateral toenails. Neuro:Alert and oriented x3. No focal deficit noted. Psych:Pleasant mood and affect.   Assessment & Plan:   Onycholysis Patient continues to have chronic onycholysis with melanosis, hyperkeratosis and the nails on the right hand and bilateral toenails.  She did see dermatology since her last visit who recommended that she return whenever her nails have grown out a little bit so that they can take a sample but they did do a scraping for KOH which did not show any abnormalities. - Recommend letting her nails grow out and follow-up with dermatology at [facility]  Recurrent BV Patient has history of  recurrent bacterial vaginitis and was recently seen in the ED on 03/26/2023 where she was treated for bacterial vaginitis with Flagyl.  She finished her course a few days ago.  She continues to have the same discharge without any change in symptoms.  She would like to do a self swab today. - Cervical vaginal self swab  Strain of lumbar region Patient has longstanding intermittent low back pain without evidence of radicular symptoms, red flag symptoms, or known inciting event.  She is currently taking tramadol, Tylenol 3, ibuprofen, and an unknown muscle relaxer.  We discussed safe use of medications and dosing.  Also discussed physical therapy which she has not done in the past. - Pain control with Tylenol 1000 mg every 6 hours as needed, Robaxin 750 mg every 6 hours as needed, and ibuprofen 400-800 mg every 8 hours if the other 2 were not working - Referral for physical therapy - If no improvement with current plan we will consider MRI for a 42 year old patient with chronic/subacute back pain    Patient discussed with Dr. Claudie Revering, DO Internal [facility] Center Internal Medicine Resident PGY-2 Clinic Phone: [phone] Pager: [phone]

## 2023-04-13 ENCOUNTER — Telehealth: Payer: Self-pay | Admitting: *Deleted

## 2023-04-13 LAB — CERVICOVAGINAL ANCILLARY ONLY
Bacterial Vaginitis (gardnerella): NEGATIVE
Candida Glabrata: NEGATIVE
Candida Vaginitis: NEGATIVE
Chlamydia: NEGATIVE
Comment: NEGATIVE
Comment: NEGATIVE
Comment: NEGATIVE
Comment: NEGATIVE
Comment: NEGATIVE
Comment: NORMAL
Neisseria Gonorrhea: NEGATIVE
Trichomonas: NEGATIVE

## 2023-04-13 NOTE — Telephone Encounter (Signed)
Call to patient informed her that the pain she is having is more than likely strained muscles that she and Dr. Geraldo Pitter discussed on yesterday.  Do as advised in your visit on yesterday.  Take the medications you were prescribed and if your symptoms worsen or d not improve significantly in the next month or so to return to the Clinics.  Otherwise will see you in 2-3 months for follow up.   Patient voiced understanding pf and will call if pain worsens or does not get better.

## 2023-04-15 DIAGNOSIS — S39012A Strain of muscle, fascia and tendon of lower back, initial encounter: Secondary | ICD-10-CM | POA: Insufficient documentation

## 2023-04-15 NOTE — Assessment & Plan Note (Signed)
Patient continues to have chronic onycholysis with melanosis, hyperkeratosis and the nails on the right hand and bilateral toenails.  She did see dermatology since her last visit who recommended that she return whenever her nails have grown out a little bit so that they can take a sample but they did do a scraping for KOH which did not show any abnormalities. - Recommend letting her nails grow out and follow-up with dermatology at [facility]

## 2023-04-15 NOTE — Assessment & Plan Note (Signed)
Patient has longstanding intermittent low back pain without evidence of radicular symptoms, red flag symptoms, or known inciting event.  She is currently taking tramadol, Tylenol 3, ibuprofen, and an unknown muscle relaxer.  We discussed safe use of medications and dosing.  Also discussed physical therapy which she has not done in the past. - Pain control with Tylenol 1000 mg every 6 hours as needed, Robaxin 750 mg every 6 hours as needed, and ibuprofen 400-800 mg every 8 hours if the other 2 were not working - Referral for physical therapy - If no improvement with current plan we will consider MRI for a 42 year old patient with chronic/subacute back pain

## 2023-04-15 NOTE — Assessment & Plan Note (Signed)
Patient has history of recurrent bacterial vaginitis and was recently seen in the ED on 03/26/2023 where she was treated for bacterial vaginitis with Flagyl.  She finished her course a few days ago.  She continues to have the same discharge without any change in symptoms.  She would like to do a self swab today. - Cervical vaginal self swab

## 2023-04-19 NOTE — Progress Notes (Signed)
Internal Medicine Clinic Attending  Case discussed with the resident at the time of the visit.  We reviewed the resident's history and exam and pertinent patient test results.  I agree with the assessment, diagnosis, and plan of care documented in the resident's note.  

## 2023-04-22 ENCOUNTER — Other Ambulatory Visit: Payer: Self-pay | Admitting: Student

## 2023-04-22 DIAGNOSIS — B9689 Other specified bacterial agents as the cause of diseases classified elsewhere: Secondary | ICD-10-CM

## 2023-04-22 MED ORDER — METRONIDAZOLE 0.75 % VA GEL: 1.0000 | VAGINAL | 3 refills | Status: AC

## 2023-04-22 NOTE — Progress Notes (Signed)
Discussed negative cervicovaginal swab results.  Patient symptoms have also improved since her visit.  She does ask about metronidazole gel for prophylaxis.  Reviewing her notes she has been on twice weekly metronidazole gel for BV prophylaxis.  I will send in refill for her.

## 2023-05-31 ENCOUNTER — Ambulatory Visit (INDEPENDENT_AMBULATORY_CARE_PROVIDER_SITE_OTHER): Payer: Medicaid Other | Admitting: Student

## 2023-05-31 ENCOUNTER — Encounter: Payer: Self-pay | Admitting: Student

## 2023-05-31 VITALS — BP 107/65 | HR 68 | Temp 98.2°F | Ht 67.0 in | Wt 175.1 lb

## 2023-05-31 DIAGNOSIS — Z1231 Encounter for screening mammogram for malignant neoplasm of breast: Secondary | ICD-10-CM

## 2023-05-31 DIAGNOSIS — Z23 Encounter for immunization: Secondary | ICD-10-CM

## 2023-05-31 DIAGNOSIS — Z0000 Encounter for general adult medical examination without abnormal findings: Secondary | ICD-10-CM

## 2023-05-31 DIAGNOSIS — S39012D Strain of muscle, fascia and tendon of lower back, subsequent encounter: Secondary | ICD-10-CM | POA: Diagnosis not present

## 2023-05-31 DIAGNOSIS — L601 Onycholysis: Secondary | ICD-10-CM | POA: Diagnosis present

## 2023-05-31 NOTE — Assessment & Plan Note (Signed)
Patient has chronic onycholysis with melanosis and hyperkeratosis of the R hand and bilateral toenails. Dermatology was unable to take a sample previously, but her nails have grown out now. Provided instructions to follow up with dermatology at [facility]. - Follow up with derm

## 2023-05-31 NOTE — Assessment & Plan Note (Addendum)
Patient received flu vaccine today. Patient asked for and received referral for mammogram today. Reports family history of breast cancer in her maternal aunt, diagnosed in her 82s.

## 2023-05-31 NOTE — Progress Notes (Addendum)
This is a Psychologist, occupational Note.  The care of the patient was discussed with Dr. Lafonda Mosses and the assessment and plan was formulated with their assistance.  Please see their note for official documentation of the patient encounter.   Subjective:   Patient ID: Destiny Wallace female   DOB: 03/09/1981 42 y.o.   MRN: [id number]  HPI: Destiny Wallace is a 42 y.o. female presenting for  mammogram referral and follow up of chronic medical conditions  Past Medical History:  Diagnosis Date   Hypertension during pregnancy    Current Outpatient Medications  Medication Sig Dispense Refill   acetaminophen (TYLENOL) 500 MG tablet Take 2 tablets (1,000 mg total) by mouth every 6 (six) hours as needed. 100 tablet 2   ibuprofen (ADVIL) 800 MG tablet Take 400-800 mg by mouth every 8 (eight) hours as needed.     methocarbamol (ROBAXIN) 750 MG tablet Take 1 tablet (750 mg total) by mouth every 6 (six) hours as needed for muscle spasms. 60 tablet 1   metroNIDAZOLE (METROGEL) 0.75 % vaginal gel Place 1 Applicatorful vaginally 2 (two) times a week for 28 doses. 70 g 3   No current facility-administered medications for this visit.   Family History  Problem Relation Age of Onset   Asthma Mother    Hypertension Mother    Anxiety disorder Mother    Social History   Socioeconomic History   Marital status: Single    Spouse name: Not on file   Number of children: Not on file   Years of education: Not on file   Highest education level: Not on file  Occupational History   Not on file  Tobacco Use   Smoking status: Never   Smokeless tobacco: Never  Vaping Use   Vaping status: Never Used  Substance and Sexual Activity   Alcohol use: Not Currently    Comment: Occassionally.   Drug use: Yes    Types: Marijuana    Comment: Last time used: Beginning of Sept. 2024   Sexual activity: Not Currently    Birth control/protection: Surgical    Comment: Last encounter: Mid July 2024  Other Topics Concern    Not on file  Social History Narrative   ** Merged History Encounter **       Social Determinants of Health   Financial Resource Strain: Low Risk  (09/08/2022)   Overall Financial Resource Strain (CARDIA)    Difficulty of Paying Living Expenses: Not hard at all  Food Insecurity: No Food Insecurity (09/08/2022)   Hunger Vital Sign    Worried About Running Out of Food in the Last Year: Never true    Ran Out of Food in the Last Year: Never true  Transportation Needs: No Transportation Needs (09/08/2022)   PRAPARE - Administrator, Civil Service (Medical): No    Lack of Transportation (Non-Medical): No  Physical Activity: Inactive (09/08/2022)   Exercise Vital Sign    Days of Exercise per Week: 0 days    Minutes of Exercise per Session: 0 min  Stress: No Stress Concern Present (09/08/2022)   [employer] of Occupational Health - Occupational Stress Questionnaire    Feeling of Stress : Not at all  Social Connections: Unknown (05/17/2023)   Received from [facility]   Social Network    Social Network: Not on file   Review of Systems: Pertinent items noted in HPI and remainder of comprehensive ROS otherwise negative.  Objective:  Physical Exam: Vitals:  05/31/23 1523  BP: 107/65  Pulse: 68  Temp: 98.2 F (36.8 C)  TempSrc: Oral  SpO2: 99%  Weight: 175 lb 1.6 oz (79.4 kg)  Height: 5\' 7"  (1.702 m)   BP 107/65 (BP Location: Left Arm, Patient Position: Sitting, Cuff Size: Normal)   Pulse 68   Temp 98.2 F (36.8 C) (Oral)   Ht 5\' 7"  (1.702 m)   Wt 175 lb 1.6 oz (79.4 kg)   SpO2 99%   BMI 27.42 kg/m  General appearance: alert, cooperative, and no distress Cardio: RRR, no murmurs Pulmonary: CTAB, no wheezing Skin: warm and dry; onycholysis present in R hand nails Neuro: Full ROM in lower extremities. Straight leg test negative.  Psych: Pleasant mood and affect  Assessment & Plan:  Lumbar strain Patient reports long history of intermittent low back pain  without red flag symptoms. She manages pain episodes with Tylenol 1000 mh q6H and Robaxin 750 mg q6H as needed, then ibuprofen 400-600 mg q8H if the other two do not resolve her pain. She only uses her medications when she feels the pain incoming and reports minimal pain over the last 3 weeks. Straight leg test today was negative. She was previously referred to PT, but reports that she did not receive the call, so we provided the phone number for her to call back. Will not change medications at this time and encouraged PT, even though she is feeling better. - Continue Tylenol 1000 mh q6H and Robaxin 750 mg q6H as needed, then ibuprofen 400-600 mg q8H as needed - Encouraged calling back to PT  Onycholysis Patient has chronic onycholysis with melanosis and hyperkeratosis of the R hand and bilateral toenails. Dermatology was unable to take a sample previously, but her nails have grown out now. Provided instructions to follow up with dermatology at [facility]. - Follow up with derm  Healthcare maintenance Patient received flu vaccine today. Patient asked for and received referral for mammogram today. Reports family history of breast cancer in her maternal aunt, diagnosed in her 53s.   Thea Alken, MS3

## 2023-05-31 NOTE — Patient Instructions (Addendum)
Thank you, Ms. EEVIE GRELLA for allowing [place] to provide your care today. Today we discussed:  Screening mammogram: We placed the referral for you for that mammogram. They should call you to schedule that.   Low back pain:  I'm glad to hear the pain medications are working! Continue using them as you have when you feel the pain coming on. Please call back physical therapy at the number below, as they can help you strengthen your muscles to reduce pain.  [facility] at [facility] Physical therapist in [place], [place] Address: [address] [place], [place], [place] [address] Phone: [phone]   For your nails, call: [facility] at [facility] center in [place], [place] Address: [address], [place], [place] [address] Phone: [phone]    Follow up: 1 year   We look forward to seeing you next time. Please call our clinic at [phone] if you have any questions or concerns. The best time to call is Monday-Friday from 9am-4pm, but there is someone available 24/7. If after hours or the weekend, call the main hospital number and ask for the Internal Medicine Resident On-Call. If you need medication refills, please notify your pharmacy one week in advance and they will send [place] a request.   Thank you for trusting me with your care. Wishing you the best!   Thea Alken, MS3

## 2023-05-31 NOTE — Assessment & Plan Note (Signed)
Patient reports long history of intermittent low back pain without red flag symptoms. She manages pain episodes with Tylenol 1000 mh q6H and Robaxin 750 mg q6H as needed, then ibuprofen 400-600 mg q8H if the other two do not resolve her pain. She only uses her medications when she feels the pain incoming and reports minimal pain over the last 3 weeks. Straight leg test today was negative. She was previously referred to PT, but reports that she did not receive the call, so we provided the phone number for her to call back. Will not change medications at this time and encouraged PT, even though she is feeling better. - Continue Tylenol 1000 mh q6H and Robaxin 750 mg q6H as needed, then ibuprofen 400-600 mg q8H as needed - Encouraged calling back to PT

## 2023-06-01 NOTE — Progress Notes (Signed)
Internal Medicine Clinic Attending  Case discussed with the resident at the time of the visit.  We reviewed the resident's history and exam and pertinent patient test results.  I agree with the assessment, diagnosis, and plan of care documented in the resident's note.  

## 2023-06-01 NOTE — Addendum Note (Signed)
Addended by: Derrek Monaco on: 06/01/2023 10:20 AM   Modules accepted: Level of Service

## 2023-06-17 ENCOUNTER — Ambulatory Visit: Payer: Medicaid Other

## 2023-06-24 ENCOUNTER — Ambulatory Visit
Admission: RE | Admit: 2023-06-24 | Discharge: 2023-06-24 | Disposition: A | Discharge status: Home / Self Care | Payer: Medicaid Other | Source: Ambulatory Visit | Attending: Internal Medicine | Admitting: Internal Medicine

## 2023-06-24 ENCOUNTER — Encounter: Payer: Self-pay | Admitting: Student

## 2023-06-24 DIAGNOSIS — Z1231 Encounter for screening mammogram for malignant neoplasm of breast: Secondary | ICD-10-CM

## 2023-06-29 ENCOUNTER — Other Ambulatory Visit: Payer: Self-pay | Admitting: Internal Medicine

## 2023-06-29 DIAGNOSIS — R928 Other abnormal and inconclusive findings on diagnostic imaging of breast: Secondary | ICD-10-CM

## 2023-07-21 ENCOUNTER — Other Ambulatory Visit: Payer: Medicaid Other

## 2023-08-16 ENCOUNTER — Telehealth: Payer: Self-pay | Admitting: *Deleted

## 2023-08-16 NOTE — Telephone Encounter (Signed)
Spoke with patient states she will call and make her own appointment states she has their phone number and will call.

## 2023-08-17 ENCOUNTER — Telehealth: Payer: Self-pay | Admitting: *Deleted

## 2023-08-17 NOTE — Telephone Encounter (Signed)
See phone noter 08/16/2023 regarding phone note about mammogram.

## 2023-12-08 ENCOUNTER — Other Ambulatory Visit (HOSPITAL_COMMUNITY): Payer: Self-pay

## 2023-12-08 ENCOUNTER — Encounter (HOSPITAL_COMMUNITY): Payer: Self-pay

## 2023-12-08 ENCOUNTER — Other Ambulatory Visit: Payer: Self-pay

## 2023-12-08 ENCOUNTER — Emergency Department (HOSPITAL_COMMUNITY)
Admission: EM | Admit: 2023-12-08 | Discharge: 2023-12-08 | Disposition: A | Discharge status: Home / Self Care | Attending: Emergency Medicine | Admitting: Emergency Medicine

## 2023-12-08 DIAGNOSIS — D509 Iron deficiency anemia, unspecified: Secondary | ICD-10-CM | POA: Insufficient documentation

## 2023-12-08 DIAGNOSIS — R112 Nausea with vomiting, unspecified: Secondary | ICD-10-CM | POA: Diagnosis present

## 2023-12-08 DIAGNOSIS — R110 Nausea: Secondary | ICD-10-CM

## 2023-12-08 LAB — CBC
HCT: 29.3 % — ABNORMAL LOW (ref 36.0–46.0)
Hemoglobin: 7.9 g/dL — ABNORMAL LOW (ref 12.0–15.0)
MCH: 16.3 pg — ABNORMAL LOW (ref 26.0–34.0)
MCHC: 27 g/dL — ABNORMAL LOW (ref 30.0–36.0)
MCV: 60.4 fL — ABNORMAL LOW (ref 80.0–100.0)
Platelets: 445 10*3/uL — ABNORMAL HIGH (ref 150–400)
RBC: 4.85 MIL/uL (ref 3.87–5.11)
RDW: 20.6 % — ABNORMAL HIGH (ref 11.5–15.5)
WBC: 3.5 10*3/uL — ABNORMAL LOW (ref 4.0–10.5)
nRBC: 0 % (ref 0.0–0.2)

## 2023-12-08 LAB — COMPREHENSIVE METABOLIC PANEL WITH GFR
ALT: 12 U/L (ref 0–44)
AST: 19 U/L (ref 15–41)
Albumin: 3.9 g/dL (ref 3.5–5.0)
Alkaline Phosphatase: 46 U/L (ref 38–126)
Anion gap: 7 (ref 5–15)
BUN: 6 mg/dL (ref 6–20)
CO2: 24 mmol/L (ref 22–32)
Calcium: 8.7 mg/dL — ABNORMAL LOW (ref 8.9–10.3)
Chloride: 105 mmol/L (ref 98–111)
Creatinine, Ser: 0.73 mg/dL (ref 0.44–1.00)
GFR, Estimated: >60 mL/min (ref >60)
Glucose, Bld: 91 mg/dL (ref 70–99)
Potassium: 3.2 mmol/L — ABNORMAL LOW (ref 3.5–5.1)
Sodium: 136 mmol/L (ref 135–145)
Total Bilirubin: 0.7 mg/dL (ref 0.0–1.2)
Total Protein: 7 g/dL (ref 6.5–8.1)

## 2023-12-08 LAB — IRON AND TIBC
Iron: 10 ug/dL — ABNORMAL LOW (ref 28–170)
Saturation Ratios: 2 % — ABNORMAL LOW (ref 10.4–31.8)
TIBC: 494 ug/dL — ABNORMAL HIGH (ref 250–450)
UIBC: 484 ug/dL

## 2023-12-08 LAB — RETICULOCYTES
Immature Retic Fract: 11.2 % (ref 2.3–15.9)
RBC.: 4.44 MIL/uL (ref 3.87–5.11)
Retic Count, Absolute: 44.4 10*3/uL (ref 19.0–186.0)
Retic Ct Pct: 1 % (ref 0.4–3.1)

## 2023-12-08 LAB — LIPASE, BLOOD: Lipase: 37 U/L (ref 11–51)

## 2023-12-08 LAB — HCG, SERUM, QUALITATIVE: Preg, Serum: NEGATIVE

## 2023-12-08 LAB — FERRITIN: Ferritin: 4 ng/mL — ABNORMAL LOW (ref 11–307)

## 2023-12-08 LAB — VITAMIN B12: Vitamin B-12: 343 pg/mL (ref 180–914)

## 2023-12-08 LAB — POC OCCULT BLOOD, ED: Fecal Occult Bld: NEGATIVE

## 2023-12-08 LAB — FOLATE: Folate: 19.7 ng/mL (ref >5.9)

## 2023-12-08 MED ORDER — ONDANSETRON HCL 4 MG PO TABS
4.0000 mg | ORAL_TABLET | Freq: Four times a day (QID) | ORAL | 0 refills | Status: AC
  Filled 2023-12-08: qty 12, 3d supply, fill #0

## 2023-12-08 MED ORDER — ONDANSETRON 4 MG PO TBDP
4.0000 mg | ORAL_TABLET | Freq: Once | ORAL | Status: AC
  Administered 2023-12-08: 4 mg via ORAL
  Filled 2023-12-08: qty 1

## 2023-12-08 MED ORDER — FERROUS SULFATE 325 (65 FE) MG PO TABS
325.0000 mg | ORAL_TABLET | Freq: Every day | ORAL | 0 refills | Status: DC
  Filled 2023-12-08: qty 30, 30d supply, fill #0

## 2023-12-08 MED ORDER — POTASSIUM CHLORIDE CRYS ER 20 MEQ PO TBCR
40.0000 meq | EXTENDED_RELEASE_TABLET | Freq: Once | ORAL | Status: AC
  Administered 2023-12-08: 40 meq via ORAL
  Filled 2023-12-08: qty 2

## 2023-12-08 NOTE — ED Provider Notes (Signed)
 [place] EMERGENCY DEPARTMENT AT [facility] Provider Note   CSN: [id number] Arrival date & time: 12/08/23  0865     History  Chief Complaint  Patient presents with   Nausea    Destiny Wallace is a 43 y.o. female.  HPI   Pt started having vomiting and diarrhea since Sunday.  Pt had two episodes Sunday, 1 the following day and none today.  She went to work but did not feel well enough.  No pain, no fevers although Sunday and yesterday she felt aches and sore.  She felt feverish the other day.  No blood in stool.  Recent ill contact.  Home Medications Prior to Admission medications   Medication Sig Start Date End Date Taking? Authorizing Provider  ondansetron  (ZOFRAN ) 4 MG tablet Take 1 tablet (4 mg total) by mouth every 6 (six) hours. 12/08/23  Yes Trish Furl, MD  ferrous sulfate 325 (65 FE) MG tablet Take 1 tablet (325 mg total) by mouth daily. 12/08/23   Trish Furl, MD      Allergies    Patient has no known allergies.    Review of Systems   Review of Systems  Physical Exam Updated Vital Signs BP 122/78   Pulse (!) 57   Temp 98.1 F (36.7 C) (Oral)   Resp 16   Ht 1.702 m (5\' 7" )   Wt 79.4 kg   SpO2 100%   BMI 27.41 kg/m  Physical Exam Vitals and nursing note reviewed. Exam conducted with a chaperone present.  Constitutional:      General: She is not in acute distress.    Appearance: She is well-developed.  HENT:     Head: Normocephalic and atraumatic.     Right Ear: External ear normal.     Left Ear: External ear normal.  Eyes:     General: No scleral icterus.       Right eye: No discharge.        Left eye: No discharge.     Conjunctiva/sclera: Conjunctivae normal.  Neck:     Trachea: No tracheal deviation.  Cardiovascular:     Rate and Rhythm: Normal rate and regular rhythm.  Pulmonary:     Effort: Pulmonary effort is normal. No respiratory distress.     Breath sounds: Normal breath sounds. No stridor. No wheezing or rales.  Abdominal:      General: Bowel sounds are normal. There is no distension.     Palpations: Abdomen is soft.     Tenderness: There is no abdominal tenderness. There is no guarding or rebound.  Genitourinary:    Rectum: No mass.     Comments: No gross blood noted , stool brown Musculoskeletal:        General: No tenderness or deformity.     Cervical back: Neck supple.  Skin:    General: Skin is warm and dry.     Findings: No rash.  Neurological:     General: No focal deficit present.     Mental Status: She is alert.     Cranial Nerves: No cranial nerve deficit, dysarthria or facial asymmetry.     Sensory: No sensory deficit.     Motor: No abnormal muscle tone or seizure activity.     Coordination: Coordination normal.  Psychiatric:        Mood and Affect: Mood normal.     ED Results / Procedures / Treatments   Labs (all labs ordered are listed, but only abnormal results are  displayed) Labs Reviewed  COMPREHENSIVE METABOLIC PANEL WITH GFR - Abnormal; Notable for the following components:      Result Value   Potassium 3.2 (*)    Calcium 8.7 (*)    All other components within normal limits  CBC - Abnormal; Notable for the following components:   WBC 3.5 (*)    Hemoglobin 7.9 (*)    HCT 29.3 (*)    MCV 60.4 (*)    MCH 16.3 (*)    MCHC 27.0 (*)    RDW 20.6 (*)    Platelets 445 (*)    All other components within normal limits  IRON AND TIBC - Abnormal; Notable for the following components:   Iron 10 (*)    TIBC 494 (*)    Saturation Ratios 2 (*)    All other components within normal limits  FERRITIN - Abnormal; Notable for the following components:   Ferritin 4 (*)    All other components within normal limits  LIPASE, BLOOD  HCG, SERUM, QUALITATIVE  VITAMIN B12  RETICULOCYTES  FOLATE  POC OCCULT BLOOD, ED    EKG None  Radiology No results found.  Procedures Procedures    Medications Ordered in ED Medications  ondansetron  (ZOFRAN -ODT) disintegrating tablet 4 mg (4 mg  Oral Given 12/08/23 0858)  potassium chloride  SA (KLOR-CON  M) CR tablet 40 mEq (40 mEq Oral Given 12/08/23 0954)    ED Course/ Medical Decision Making/ A&P Clinical Course as of 12/08/23 1151  Wed Dec 08, 2023  0933 CBC(!) Labs reviewed.  Hemoglobin decreased compared to previous [JK]  0933 Comprehensive metabolic panel(!) Metabolic panel shows mild hypokalemia [JK]  1133 Fecal occult negative. [JK]    Clinical Course User Index [JK] Trish Furl, MD                                 Medical Decision Making Problems Addressed: Iron deficiency anemia, unspecified iron deficiency anemia type: acute illness or injury that poses a threat to life or bodily functions Nausea: acute illness or injury that poses a threat to life or bodily functions  Amount and/or Complexity of Data Reviewed Labs: ordered. Decision-making details documented in ED Course.  Risk OTC drugs. Prescription drug management.   Patient presented to the ED for evaluation of nausea malaise, fatigue weakness.  Patient recently had nausea vomiting diarrhea for the last couple days.  She has not had any symptoms today but did not feel well enough to go to work.  Patient's exam is reassuring.  She does not have any abdominal tenderness.  I doubt diverticulitis colitis appendicitis based on her exam.  Her laboratory tests do not show any signs of hepatitis or pancreatitis.  Patient was notably anemic.  This has decreased compared to previous values.  Patient has not noticed any blood in her stool.  She was Hemoccult negative.  It is possible her anemia is related to her menstrual periods patient's iron studies do suggest iron deficiency anemia.  Will start her on iron tablets.  Will have her follow-up with her primary care doctor to be rechecked.  Evaluation and diagnostic testing in the emergency department does not suggest an emergent condition requiring admission or immediate intervention beyond what has been performed at this  time.  The patient is safe for discharge and has been instructed to return immediately for worsening symptoms, change in symptoms or any other concerns.  Final Clinical Impression(s) / ED Diagnoses Final diagnoses:  Nausea  Iron deficiency anemia, unspecified iron deficiency anemia type    Rx / DC Orders ED Discharge Orders          Ordered    ferrous sulfate 325 (65 FE) MG tablet  Daily,   Status:  Discontinued        12/08/23 1147    ferrous sulfate 325 (65 FE) MG tablet  Daily        12/08/23 1148    ondansetron  (ZOFRAN ) 4 MG tablet  Every 6 hours        12/08/23 1148              Trish Furl, MD 12/08/23 1151

## 2023-12-08 NOTE — Discharge Instructions (Addendum)
 Start taking the iron tablets to help with your anemia.  Follow-up with your primary care doctor to have that rechecked.  Take the Zofran  to help with your nausea symptoms

## 2023-12-08 NOTE — ED Triage Notes (Signed)
 Patient reports having n/v/d on Sunday and feeling weak and it started back again this morning at work.  Reports had body aches but doesn't today. Denies urinary sytmpoms.

## 2023-12-20 ENCOUNTER — Other Ambulatory Visit (HOSPITAL_COMMUNITY): Payer: Self-pay

## 2024-03-11 ENCOUNTER — Emergency Department (HOSPITAL_COMMUNITY)
Admission: EM | Admit: 2024-03-11 | Discharge: 2024-03-11 | Disposition: A | Discharge status: Home / Self Care | Attending: Emergency Medicine | Admitting: Emergency Medicine

## 2024-03-11 DIAGNOSIS — R112 Nausea with vomiting, unspecified: Secondary | ICD-10-CM | POA: Diagnosis present

## 2024-03-11 DIAGNOSIS — E876 Hypokalemia: Secondary | ICD-10-CM | POA: Diagnosis not present

## 2024-03-11 DIAGNOSIS — I10 Essential (primary) hypertension: Secondary | ICD-10-CM | POA: Insufficient documentation

## 2024-03-11 DIAGNOSIS — U071 COVID-19: Secondary | ICD-10-CM | POA: Insufficient documentation

## 2024-03-11 LAB — CBC WITH DIFFERENTIAL/PLATELET
Abs Immature Granulocytes: 0.01 K/uL (ref 0.00–0.07)
Basophils Absolute: 0 K/uL (ref 0.0–0.1)
Basophils Relative: 0 %
Eosinophils Absolute: 0 K/uL (ref 0.0–0.5)
Eosinophils Relative: 1 %
HCT: 25.4 % — ABNORMAL LOW (ref 36.0–46.0)
Hemoglobin: 7.1 g/dL — ABNORMAL LOW (ref 12.0–15.0)
Immature Granulocytes: 0 %
Lymphocytes Relative: 4 %
Lymphs Abs: 0.2 K/uL — ABNORMAL LOW (ref 0.7–4.0)
MCH: 17 pg — ABNORMAL LOW (ref 26.0–34.0)
MCHC: 28 g/dL — ABNORMAL LOW (ref 30.0–36.0)
MCV: 60.8 fL — ABNORMAL LOW (ref 80.0–100.0)
Monocytes Absolute: 0.4 K/uL (ref 0.1–1.0)
Monocytes Relative: 8 %
Neutro Abs: 5 K/uL (ref 1.7–7.7)
Neutrophils Relative %: 87 %
Platelets: 303 K/uL (ref 150–400)
RBC: 4.18 MIL/uL (ref 3.87–5.11)
RDW: 20.1 % — ABNORMAL HIGH (ref 11.5–15.5)
WBC: 5.7 K/uL (ref 4.0–10.5)
nRBC: 0 % (ref 0.0–0.2)

## 2024-03-11 LAB — COMPREHENSIVE METABOLIC PANEL WITH GFR
ALT: 7 U/L (ref 0–44)
AST: 20 U/L (ref 15–41)
Albumin: 4.4 g/dL (ref 3.5–5.0)
Alkaline Phosphatase: 57 U/L (ref 38–126)
Anion gap: 11 (ref 5–15)
BUN: 8 mg/dL (ref 6–20)
CO2: 21 mmol/L — ABNORMAL LOW (ref 22–32)
Calcium: 9 mg/dL (ref 8.9–10.3)
Chloride: 105 mmol/L (ref 98–111)
Creatinine, Ser: 0.75 mg/dL (ref 0.44–1.00)
GFR, Estimated: >60 mL/min (ref >60)
Glucose, Bld: 97 mg/dL (ref 70–99)
Potassium: 3.4 mmol/L — ABNORMAL LOW (ref 3.5–5.1)
Sodium: 137 mmol/L (ref 135–145)
Total Bilirubin: 0.3 mg/dL (ref 0.0–1.2)
Total Protein: 6.9 g/dL (ref 6.5–8.1)

## 2024-03-11 LAB — RESP PANEL BY RT-PCR (RSV, FLU A&B, COVID)  RVPGX2
Influenza A by PCR: NEGATIVE
Influenza B by PCR: NEGATIVE
Resp Syncytial Virus by PCR: NEGATIVE
SARS Coronavirus 2 by RT PCR: POSITIVE — AB

## 2024-03-11 MED ORDER — KETOROLAC TROMETHAMINE 15 MG/ML IJ SOLN
15.0000 mg | Freq: Once | INTRAMUSCULAR | Status: AC
  Administered 2024-03-11: 15 mg via INTRAVENOUS
  Filled 2024-03-11: qty 1

## 2024-03-11 MED ORDER — ONDANSETRON 4 MG PO TBDP: 4.0000 mg | ORAL_TABLET | Freq: Three times a day (TID) | ORAL | 0 refills | Status: AC | PRN

## 2024-03-11 MED ORDER — FERROUS SULFATE 325 (65 FE) MG PO TABS: 325.0000 mg | ORAL_TABLET | Freq: Every day | ORAL | 0 refills | Status: AC

## 2024-03-11 MED ORDER — PANTOPRAZOLE SODIUM 40 MG IV SOLR
40.0000 mg | Freq: Once | INTRAVENOUS | Status: AC
  Administered 2024-03-11: 40 mg via INTRAVENOUS
  Filled 2024-03-11: qty 10

## 2024-03-11 MED ORDER — ONDANSETRON HCL 4 MG/2ML IJ SOLN
4.0000 mg | Freq: Once | INTRAMUSCULAR | Status: AC
  Administered 2024-03-11: 4 mg via INTRAVENOUS
  Filled 2024-03-11: qty 2

## 2024-03-11 NOTE — Discharge Instructions (Addendum)
 I am glad you area feeling better. You will need to follow up with primary care. Seek emergency care if experiencing any new or worsening symptoms.  Alternating between 650 mg Tylenol  and 400 mg Advil : The best way to alternate taking Acetaminophen  (example Tylenol ) and Ibuprofen  (example Advil /Motrin ) is to take them 3 hours apart. For example, if you take ibuprofen  at 6 am you can then take Tylenol  at 9 am. You can continue this regimen throughout the day, making sure you do not exceed the recommended maximum dose for each drug.

## 2024-03-11 NOTE — ED Triage Notes (Signed)
 Pt BIBA from home for N/V, body aches x 3 hours.

## 2024-03-11 NOTE — ED Provider Notes (Signed)
 [place] EMERGENCY DEPARTMENT AT [facility] Provider Note   CSN: [id number] Arrival date & time: 03/11/24  1057     Patient presents with: Generalized Body Aches and Nausea   Destiny Wallace is a 43 y.o. female with PMHx HTN who presents to the ED concerned for rhinorrhea, nausea, and vomiting that started around 3 hours ago. Also endorses one episode of diarrhea today. Patient also concerned for body aches. Patient has not taken any OTC medications for their symptoms. Patient currently denying abdominal pain. Patient is currently on their menstrual cycle. Endorses subjective fevers/hot flashes. Patient denies sick contacts or suspicious food intake.    HPI     Prior to Admission medications   Medication Sig Start Date End Date Taking? Authorizing Provider  ondansetron  (ZOFRAN -ODT) 4 MG disintegrating tablet Take 1 tablet (4 mg total) by mouth every 8 (eight) hours as needed for nausea or vomiting. 03/11/24  Yes Hoy Nidia FALCON, PA-C  ferrous sulfate  325 (65 FE) MG tablet Take 1 tablet (325 mg total) by mouth daily. 03/11/24   Hoy Nidia FALCON, PA-C  ondansetron  (ZOFRAN ) 4 MG tablet Take 1 tablet (4 mg total) by mouth every 6 (six) hours. 12/08/23   Randol Simmonds, MD    Allergies: Patient has no known allergies.    Review of Systems  Gastrointestinal:  Positive for nausea.    Updated Vital Signs BP 125/82 (BP Location: Left Arm)   Pulse 64   Temp 99.1 F (37.3 C) (Oral)   Resp 16   SpO2 100%   Physical Exam Vitals and nursing note reviewed.  Constitutional:      General: She is not in acute distress.    Appearance: She is not ill-appearing or toxic-appearing.  HENT:     Head: Normocephalic and atraumatic.     Mouth/Throat:     Mouth: Mucous membranes are moist.  Eyes:     General: No scleral icterus.       Right eye: No discharge.        Left eye: No discharge.     Conjunctiva/sclera: Conjunctivae normal.  Cardiovascular:     Rate and Rhythm:  Normal rate and regular rhythm.     Pulses: Normal pulses.     Heart sounds: Normal heart sounds. No murmur heard. Pulmonary:     Effort: Pulmonary effort is normal. No respiratory distress.     Breath sounds: Normal breath sounds. No wheezing, rhonchi or rales.  Abdominal:     General: Abdomen is flat. Bowel sounds are normal. There is no distension.     Palpations: Abdomen is soft. There is no mass.     Tenderness: There is no abdominal tenderness.  Musculoskeletal:     Right lower leg: No edema.     Left lower leg: No edema.     Comments: +2 pedal pulses BL.  Skin:    General: Skin is warm and dry.     Findings: No rash.  Neurological:     General: No focal deficit present.     Mental Status: She is alert and oriented to person, place, and time. Mental status is at baseline.  Psychiatric:        Mood and Affect: Mood normal.        Behavior: Behavior normal.     (all labs ordered are listed, but only abnormal results are displayed) Labs Reviewed  RESP PANEL BY RT-PCR (RSV, FLU A&B, COVID)  RVPGX2 - Abnormal; Notable for the following components:  Result Value   SARS Coronavirus 2 by RT PCR POSITIVE (*)    All other components within normal limits  COMPREHENSIVE METABOLIC PANEL WITH GFR - Abnormal; Notable for the following components:   Potassium 3.4 (*)    CO2 21 (*)    All other components within normal limits  CBC WITH DIFFERENTIAL/PLATELET - Abnormal; Notable for the following components:   Hemoglobin 7.1 (*)    HCT 25.4 (*)    MCV 60.8 (*)    MCH 17.0 (*)    MCHC 28.0 (*)    RDW 20.1 (*)    Lymphs Abs 0.2 (*)    All other components within normal limits    EKG: None  Radiology: No results found.   Procedures   Medications Ordered in the ED  ondansetron  (ZOFRAN ) injection 4 mg (4 mg Intravenous Given 03/11/24 1210)  ketorolac  (TORADOL ) 15 MG/ML injection 15 mg (15 mg Intravenous Given 03/11/24 1210)  pantoprazole  (PROTONIX ) injection 40 mg (40 mg  Intravenous Given 03/11/24 1210)                                    Medical Decision Making Amount and/or Complexity of Data Reviewed Labs: ordered.  Risk OTC drugs. Prescription drug management.    This patient presents to the ED for concern of nausea and vomiting, this involves an extensive number of treatment options, and is a complaint that carries with it a high risk of complications and morbidity.  The differential diagnosis includes gastroenteritis, colitis, small bowel obstruction, appendicitis, cholecystitis, pancreatitis, nephrolithiasis, UTI, pyelonephritis   Co morbidities that complicate the patient evaluation  HTN   Additional history obtained:  Dr. Kristy PCP   Problem List / ED Course / Critical interventions / Medication management  Patient presented for rhinorrhea, nausea, vomiting, body aches over the past 3 hours.  Physical exam reassuring.  Patient afebrile with stable vitals. I Ordered, and personally interpreted labs.  Respiratory panel positive for COVID.  CMP with mild hypokalemia at 3.4. CO2 is also mildly low at 21. CBC with anemia with hgb at 7.1. per chart review, patient does have hx of IDA and was prescribed iron supplementation during last ED visit in 11/2023. Patient also had negative POC occult at that time. Patient stating that she never picked up her iron supplementation. I had extensive conversation with patient and educated her on the importance of starting her iron supplements.  Also stressed the importance of following up with PCP.  Patient verbalized understanding of plan.  Patient asking me to resend the iron prescription which I have done.   Patient feeling a lot better after Toradol , Zofran , and Protonix .  Patient ready to go home.  Patient tolerating PO.  I will prescribe patient Zofran . I have reviewed the patients home medicines and have made adjustments as needed The patient has been appropriately medically screened and/or stabilized in  the ED. I have low suspicion for any other emergent medical condition which would require further screening, evaluation or treatment in the ED or require inpatient management. At time of discharge the patient is hemodynamically stable and in no acute distress. I have discussed work-up results and diagnosis with patient and answered all questions. Patient is agreeable with discharge plan. We discussed strict return precautions for returning to the emergency department and they verbalized understanding.     Social Determinants of Health:  none  Final diagnoses:  COVID    ED Discharge Orders          Ordered    ondansetron  (ZOFRAN -ODT) 4 MG disintegrating tablet  Every 8 hours PRN        03/11/24 1327    ferrous sulfate  325 (65 FE) MG tablet  Daily        03/11/24 1327               Hoy Nidia FALCON, PA-C 03/11/24 1332    Bernard Drivers, MD 03/12/24 [phone]

## 2024-03-20 ENCOUNTER — Ambulatory Visit: Admitting: Student

## 2024-03-20 NOTE — Progress Notes (Deleted)
   CC: ***  HPI:  Destiny Wallace is a 43 y.o. female with a past medical history of onycholysis, back pain, recurrent BV presents for hemoglobin check.  Patient was recently seen in the emergency department on 03/11/2024 for concerns of bodyaches and nausea.  Patient was found to have hemoglobin of 7.1.  No concerns for bleeding, but was on menstrual period.  Could be related to that.  Will recheck hemoglobin today.  Past Medical History:  Diagnosis Date   Hypertension during pregnancy      Current Outpatient Medications:    ferrous sulfate  325 (65 FE) MG tablet, Take 1 tablet (325 mg total) by mouth daily., Disp: 30 tablet, Rfl: 0   ondansetron  (ZOFRAN ) 4 MG tablet, Take 1 tablet (4 mg total) by mouth every 6 (six) hours., Disp: 12 tablet, Rfl: 0   ondansetron  (ZOFRAN -ODT) 4 MG disintegrating tablet, Take 1 tablet (4 mg total) by mouth every 8 (eight) hours as needed for nausea or vomiting., Disp: 10 tablet, Rfl: 0  Review of Systems:  ***  Constitutional: Eye: Respiratory: Cardiovascular: GI: MSK: GU: Skin: Neuro: Endocrine:   Physical Exam:  There were no vitals filed for this visit. *** General: Patient is sitting comfortably in the room  Eyes: Pupils equal and reactive to light, EOM intact  Head: Normocephalic, atraumatic  Neck: Supple, nontender, full range of motion, No JVD Cardio: Regular rate and rhythm, no murmurs, rubs or gallops. 2+ pulses to bilateral upper and lower extremities  Chest: No chest tenderness Pulmonary: Clear to ausculation bilaterally with no rales, rhonchi, and crackles  Abdomen: Soft, nontender with normoactive bowel sounds with no rebound or guarding  Neuro: Alert and orientated x3. CN II-XII intact. Sensation intact to upper and lower extremities. 2+ patellar reflex.  Back: No midline tenderness, no step off or deformities noted. No paraspinal muscle tenderness.  Skin: No rashes noted  MSK: 5/5 strength to upper and lower  extremities.    Assessment & Plan:   No problem-specific Assessment & Plan notes found for this encounter.    Patient {GC/GE:[id number]::discussed with,seen with} Dr. {WJFZD:[id number]::Hlpoonli,Ynqqfjw,Floozw,Wjmzwimj,Tpoopjfd,Cpwrzwu}  Libby Blanch, DO Internal Medicine Resident PGY-3

## 2024-08-18 ENCOUNTER — Ambulatory Visit: Admitting: Student

## 2024-08-18 DIAGNOSIS — D508 Other iron deficiency anemias: Secondary | ICD-10-CM | POA: Insufficient documentation

## 2024-08-18 DIAGNOSIS — E876 Hypokalemia: Secondary | ICD-10-CM | POA: Insufficient documentation

## 2024-08-18 NOTE — Assessment & Plan Note (Signed)
 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

## 2024-08-18 NOTE — Assessment & Plan Note (Signed)
 Colonoscopy versus Cologuard 3 years.  Flu vaccine Hepatitis B vaccine

## 2024-08-18 NOTE — Assessment & Plan Note (Signed)
 BP Readings from Last 3 Encounters:  03/11/24 132/85  12/08/23 122/78  05/31/23 107/65   Lab Results  Component Value Date   CREATININE 0.75 03/11/2024   CREATININE 0.73 12/08/2023   CREATININE 0.78 11/23/2020    Patient is not on any antihypertensive medications. Denies any symptoms of dizziness, lightheadedness, vision changes, chest pain, shortness of breath, lower extremity swelling

## 2024-08-18 NOTE — Assessment & Plan Note (Signed)
 Mammogram on 06/24/2023 showed possible asymmetry in the right breast suggesting further evaluation with diagnostic mammogram and possibly ultrasound of right breast.

## 2024-08-18 NOTE — Progress Notes (Unsigned)
" ° °  Established Patient Office Visit  Subjective   Patient ID: Destiny Wallace, female    DOB: [birth year]  Age: 44 y.o. MRN: [id number]  No chief complaint on file.   Ms.Destiny Wallace is a 44 y.o. female past medical history of elevated blood pressure without history of hypertension, onycholysis with melanosis and hyperkeratosis of right hand and bilateral toenails, recurrent BV, abnormal mammogram presents today for ***  Review of Systems:  As per assessment and Plan   Objective:     There were no vitals filed for this visit. ***  Physical Exam General: Sitting in chair, no acute distress Cardiovascular: Regular rate Pulmonary: Breathing comfortably Abdomen: Soft, nontender, nondistended MSK: No lower extremity edema bilaterally  {Labs (Optional):23779}  The 10-year ASCVD risk score (Arnett DK, et al., 2019) is: 0.9%    Assessment & Plan:   Patient {GC/GE:[id number]::discussed with,seen with} Dr. 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 Healthcare maintenance Colonoscopy versus Cologuard 3 years.  Flu vaccine Hepatitis B vaccine Blood pressure elevated without history of HTN BP Readings from Last 3 Encounters:  03/11/24 132/85  12/08/23 122/78  05/31/23 107/65   Lab Results  Component Value Date   CREATININE 0.75 03/11/2024   CREATININE 0.73 12/08/2023   CREATININE 0.78 11/23/2020    Patient is not on any antihypertensive medications. Denies any symptoms of dizziness, lightheadedness, vision changes, chest pain, shortness of breath, lower extremity swelling Abnormal mammogram Mammogram on 06/24/2023 showed possible asymmetry in the right breast suggesting further evaluation with diagnostic mammogram and possibly ultrasound of right  breast. Iron deficiency anemia secondary to inadequate dietary iron intake Last Hgb 7.1/ HCT 25.4, MCV 60.8; denies any symptoms of hematochezia, melena. Hypokalemia Last potassium 3.4 on 03/11/2024 during ED visit for COVID positive.      Problem List Items Addressed This Visit   None   No follow-ups on file.    Destiny Edwards, DO "

## 2024-08-18 NOTE — Assessment & Plan Note (Signed)
 Last potassium 3.4 on 03/11/2024 during ED visit for COVID positive.

## 2024-08-18 NOTE — Assessment & Plan Note (Signed)
 Last Hgb 7.1/ HCT 25.4, MCV 60.8; denies any symptoms of hematochezia, melena.
